# Patient Record
Sex: Female | Born: 1980 | Race: White | Hispanic: No | Marital: Married | Smoking: Former smoker
Health system: Southern US, Community
[De-identification: ages and names within clinical notes are randomized; demographics above are authoritative.]

## PROBLEM LIST (undated history)

## (undated) ENCOUNTER — Ambulatory Visit: Payer: Self-pay

## (undated) ENCOUNTER — Ambulatory Visit: Admission: EM | Payer: BC Managed Care – PPO | Source: Home / Self Care

## (undated) ENCOUNTER — Inpatient Hospital Stay (HOSPITAL_COMMUNITY): Payer: Self-pay

## (undated) DIAGNOSIS — J302 Other seasonal allergic rhinitis: Secondary | ICD-10-CM

## (undated) DIAGNOSIS — R51 Headache: Secondary | ICD-10-CM

## (undated) DIAGNOSIS — R87619 Unspecified abnormal cytological findings in specimens from cervix uteri: Secondary | ICD-10-CM

## (undated) DIAGNOSIS — N83209 Unspecified ovarian cyst, unspecified side: Secondary | ICD-10-CM

## (undated) DIAGNOSIS — F419 Anxiety disorder, unspecified: Secondary | ICD-10-CM

## (undated) DIAGNOSIS — IMO0002 Reserved for concepts with insufficient information to code with codable children: Secondary | ICD-10-CM

## (undated) HISTORY — PX: SALPINGOOPHORECTOMY: SHX82

## (undated) HISTORY — PX: OTHER SURGICAL HISTORY: SHX169

## (undated) HISTORY — PX: WISDOM TOOTH EXTRACTION: SHX21

## (undated) HISTORY — DX: Other seasonal allergic rhinitis: J30.2

---

## 2012-07-21 NOTE — L&D Delivery Note (Signed)
Delivery Note At 11:53 AM a viable and healthy female was delivered via  (Presentation:OA ;  ).  APGAR:7 ,9 ; weight 7lb 15 oz .   Placenta status: spontaneous, intact not sent, .  Cord: CAN x 1 clamped, cut  with the following complications: .none  Cord pH: none Donor cord blood obtained Anesthesia: Epidural  Episiotomy: none Lacerations: none Suture Repair: none Est. Blood Loss (mL): 300 cc  Mom to postpartum.  Baby to nursery-stable.  Farley Crooker A 11/26/2012, 12:09 PM

## 2012-08-04 LAB — OB RESULTS CONSOLE RUBELLA ANTIBODY, IGM: Rubella: IMMUNE

## 2012-08-04 LAB — OB RESULTS CONSOLE GC/CHLAMYDIA
Chlamydia: NEGATIVE
Gonorrhea: NEGATIVE

## 2012-08-04 LAB — OB RESULTS CONSOLE HIV ANTIBODY (ROUTINE TESTING): HIV: NONREACTIVE

## 2012-08-06 ENCOUNTER — Ambulatory Visit (INDEPENDENT_AMBULATORY_CARE_PROVIDER_SITE_OTHER): Payer: BC Managed Care – PPO | Admitting: Emergency Medicine

## 2012-08-06 VITALS — BP 100/63 | HR 113 | Temp 98.5°F | Resp 18 | Ht 65.0 in | Wt 170.0 lb

## 2012-08-06 DIAGNOSIS — J111 Influenza due to unidentified influenza virus with other respiratory manifestations: Secondary | ICD-10-CM

## 2012-08-06 LAB — POCT INFLUENZA A/B
Influenza A, POC: POSITIVE
Influenza B, POC: NEGATIVE

## 2012-08-06 NOTE — Patient Instructions (Addendum)

## 2012-08-06 NOTE — Progress Notes (Signed)
[facility] [address] [place] [address] [phone]  Date:  08/06/2012   Name:  Shelly Burch   DOB:  Jun 12, 1981   MRN:  [id number]  PCP:  No primary provider on file.    Chief Complaint: Fever and Cough   History of Present Illness:  Shelly Burch is a 32 y.o. very pleasant female patient who presents with the following:  Sudden onset of febrile illness last night with cough.  Temp to 103.  No wheezing or shortness of breath.  Cough non productive.  Moderate nasal congestion and post nasal drainage that was clear.  No nausea or vomiting.  Flu shot Wednesday this week.  No ill contacts.  There is no problem list on file for this patient.   No past medical history on file.  Past Surgical History  Procedure Date  . Ovarian tumor removed     History  Substance Use Topics  . Smoking status: Former Smoker    Quit date: 08/07/2007  . Smokeless tobacco: Not on file  . Alcohol Use: No    Family History  Problem Relation Age of Onset  . COPD Mother   . Hypertension Mother   . Heart disease Father   . Stroke Father   . Diabetes Maternal Grandmother   . Diabetes Maternal Grandfather     No Known Allergies  Medication list has been reviewed and updated.  Current Outpatient Prescriptions on File Prior to Visit  Medication Sig Dispense Refill  . DiphenhydrAMINE HCl (BENADRYL ALLERGY PO) Take by mouth.        Review of Systems:  As per HPI, otherwise negative.    Physical Examination: Filed Vitals:   08/06/12 1129  BP: 100/63  Pulse: 113  Temp: 98.5 F (36.9 C)  Resp: 18   Filed Vitals:   08/06/12 1129  Height: 5\' 5"  (1.651 m)  Weight: 170 lb (77.111 kg)   Body mass index is 28.29 kg/(m^2). Ideal Body Weight: Weight in (lb) to have BMI = 25: 149.9   GEN: WDWN, NAD, Non-toxic, A & O x 3 HEENT: Atraumatic, Normocephalic. Neck supple. No masses, No LAD. Ears and Nose: No external deformity. CV: RRR, No M/G/R. No JVD. No  thrill. No extra heart sounds. PULM: CTA B, no wheezes, crackles, rhonchi. No retractions. No resp. distress. No accessory muscle use. ABD: S, NT, ND, +BS. No rebound. No HSM. EXTR: No c/c/e NEURO Normal gait.  PSYCH: Normally interactive. Conversant. Not depressed or anxious appearing.  Calm demeanor.    Assessment and Plan: Influenza Pregnant and tamiflu is a category c medication Robitussin DM Tylenol Fluids   Carmelina Dane, MD  Results for orders placed in visit on 08/06/12  POCT INFLUENZA A/B      Component Value Range   Influenza A, POC Positive     Influenza B, POC Negative     1

## 2012-09-14 ENCOUNTER — Inpatient Hospital Stay (HOSPITAL_COMMUNITY): Admission: AD | Admit: 2012-09-14 | Payer: Self-pay | Source: Ambulatory Visit | Admitting: Obstetrics and Gynecology

## 2012-11-15 ENCOUNTER — Encounter (HOSPITAL_COMMUNITY): Payer: Self-pay | Admitting: *Deleted

## 2012-11-15 ENCOUNTER — Inpatient Hospital Stay (HOSPITAL_COMMUNITY)
Admission: AD | Admit: 2012-11-15 | Discharge: 2012-11-15 | Disposition: A | Discharge status: Home / Self Care | Payer: BC Managed Care – PPO | Source: Ambulatory Visit | Attending: Obstetrics and Gynecology | Admitting: Obstetrics and Gynecology

## 2012-11-15 DIAGNOSIS — O479 False labor, unspecified: Secondary | ICD-10-CM | POA: Insufficient documentation

## 2012-11-15 HISTORY — DX: Reserved for concepts with insufficient information to code with codable children: IMO0002

## 2012-11-15 HISTORY — DX: Anxiety disorder, unspecified: F41.9

## 2012-11-15 HISTORY — DX: Unspecified ovarian cyst, unspecified side: N83.209

## 2012-11-15 HISTORY — DX: Unspecified abnormal cytological findings in specimens from cervix uteri: R87.619

## 2012-11-15 HISTORY — DX: Headache: R51

## 2012-11-15 NOTE — MAU Note (Signed)
Contractions started at 0330.  Coming every 3-58min. No bleeding or leaking. Was closed when last checked.  No problems with preg.

## 2012-11-15 NOTE — MAU Note (Signed)
+  GBS, measuring large.

## 2012-11-15 NOTE — MAU Note (Signed)
Pt does have power of attorney forms/advance directives : Evan.

## 2012-11-15 NOTE — MAU Note (Signed)
Still coming in clusters than breaks, but seem to be stronger.  Getting tougher to walk and talk with ctx's.

## 2012-11-18 ENCOUNTER — Encounter (HOSPITAL_COMMUNITY): Payer: Self-pay | Admitting: *Deleted

## 2012-11-18 ENCOUNTER — Other Ambulatory Visit: Payer: Self-pay | Admitting: Obstetrics and Gynecology

## 2012-11-18 ENCOUNTER — Telehealth (HOSPITAL_COMMUNITY): Payer: Self-pay | Admitting: *Deleted

## 2012-11-18 NOTE — Telephone Encounter (Signed)
Preadmission screen  

## 2012-11-25 ENCOUNTER — Inpatient Hospital Stay (HOSPITAL_COMMUNITY)
Admission: RE | Admit: 2012-11-25 | Discharge: 2012-11-27 | DRG: 373 | Disposition: A | Discharge status: Home / Self Care | Payer: BC Managed Care – PPO | Source: Ambulatory Visit | Attending: Obstetrics and Gynecology | Admitting: Obstetrics and Gynecology

## 2012-11-25 ENCOUNTER — Encounter (HOSPITAL_COMMUNITY): Payer: Self-pay

## 2012-11-25 DIAGNOSIS — Z22330 Carrier of Group B streptococcus: Secondary | ICD-10-CM

## 2012-11-25 DIAGNOSIS — O99892 Other specified diseases and conditions complicating childbirth: Secondary | ICD-10-CM | POA: Diagnosis present

## 2012-11-25 DIAGNOSIS — D509 Iron deficiency anemia, unspecified: Secondary | ICD-10-CM | POA: Diagnosis present

## 2012-11-25 LAB — RPR: RPR Ser Ql: NONREACTIVE

## 2012-11-25 LAB — CBC
MCH: 30.2 pg (ref 26.0–34.0)
MCHC: 33.1 g/dL (ref 30.0–36.0)
MCV: 91.2 fL (ref 78.0–100.0)
Platelets: 182 10*3/uL (ref 150–400)
RBC: 3.31 MIL/uL — ABNORMAL LOW (ref 3.87–5.11)

## 2012-11-25 LAB — TYPE AND SCREEN: Antibody Screen: NEGATIVE

## 2012-11-25 MED ORDER — OXYTOCIN BOLUS FROM INFUSION
500.0000 mL | INTRAVENOUS | Status: DC
  Administered 2012-11-26: 500 mL via INTRAVENOUS

## 2012-11-25 MED ORDER — ACETAMINOPHEN 325 MG PO TABS: 650.0000 mg | ORAL_TABLET | ORAL | Status: DC | PRN

## 2012-11-25 MED ORDER — MISOPROSTOL 25 MCG QUARTER TABLET
25.0000 ug | ORAL_TABLET | ORAL | Status: DC | PRN
  Administered 2012-11-25 – 2012-11-26 (×4): 25 ug via VAGINAL
  Filled 2012-11-25 (×3): qty 0.25
  Filled 2012-11-25: qty 1
  Filled 2012-11-25: qty 0.25

## 2012-11-25 MED ORDER — LACTATED RINGERS IV SOLN
INTRAVENOUS | Status: DC
  Administered 2012-11-25 – 2012-11-26 (×2): via INTRAVENOUS

## 2012-11-25 MED ORDER — CITRIC ACID-SODIUM CITRATE 334-500 MG/5ML PO SOLN: 30.0000 mL | ORAL | Status: DC | PRN

## 2012-11-25 MED ORDER — PENICILLIN G POTASSIUM 5000000 UNITS IJ SOLR
2.5000 10*6.[IU] | INTRAVENOUS | Status: DC
  Administered 2012-11-26 (×4): 2.5 10*6.[IU] via INTRAVENOUS
  Filled 2012-11-25 (×8): qty 2.5

## 2012-11-25 MED ORDER — ONDANSETRON HCL 4 MG/2ML IJ SOLN: 4.0000 mg | Freq: Four times a day (QID) | INTRAMUSCULAR | Status: DC | PRN

## 2012-11-25 MED ORDER — TERBUTALINE SULFATE 1 MG/ML IJ SOLN: 0.2500 mg | Freq: Once | INTRAMUSCULAR | Status: AC | PRN

## 2012-11-25 MED ORDER — LIDOCAINE HCL (PF) 1 % IJ SOLN
30.0000 mL | INTRAMUSCULAR | Status: DC | PRN
  Filled 2012-11-25 (×2): qty 30

## 2012-11-25 MED ORDER — OXYCODONE-ACETAMINOPHEN 5-325 MG PO TABS: 1.0000 | ORAL_TABLET | ORAL | Status: DC | PRN

## 2012-11-25 MED ORDER — OXYTOCIN 10 UNIT/ML IJ SOLN: 10.0000 [IU] | Freq: Once | INTRAMUSCULAR | Status: DC

## 2012-11-25 MED ORDER — ZOLPIDEM TARTRATE 5 MG PO TABS: 5.0000 mg | ORAL_TABLET | Freq: Every evening | ORAL | Status: DC | PRN

## 2012-11-25 MED ORDER — LACTATED RINGERS IV SOLN: 500.0000 mL | INTRAVENOUS | Status: DC | PRN

## 2012-11-25 MED ORDER — DEXTROSE 5 % IV SOLN
5.0000 10*6.[IU] | Freq: Once | INTRAVENOUS | Status: AC
  Administered 2012-11-25: 5 10*6.[IU] via INTRAVENOUS
  Filled 2012-11-25: qty 5

## 2012-11-25 MED ORDER — OXYTOCIN 40 UNITS IN LACTATED RINGERS INFUSION - SIMPLE MED
62.5000 mL/h | INTRAVENOUS | Status: DC
  Administered 2012-11-26: 62.5 mL/h via INTRAVENOUS
  Filled 2012-11-25: qty 1000

## 2012-11-25 MED ORDER — IBUPROFEN 600 MG PO TABS
600.0000 mg | ORAL_TABLET | Freq: Four times a day (QID) | ORAL | Status: DC | PRN
  Administered 2012-11-26: 600 mg via ORAL
  Filled 2012-11-25: qty 1

## 2012-11-25 NOTE — H&P (Signed)
Shelly Burch is a 32 y.o. female presenting for induction labor due to multiparity GBS cx (+)/ PNC complicated by leg swelling  Maternal Medical History:  Contractions: Frequency: irregular.    Fetal activity: Perceived fetal activity is normal.    Prenatal complications: no prenatal complications Prenatal Complications - Diabetes: none.    OB History   Grav Para Term Preterm Abortions TAB SAB Ect Mult Living   3 1 1  0 1 1 0 0 0 1     Past Medical History  Diagnosis Date  . Headache   . Ovarian cyst   . Anxiety   . Abnormal Pap smear     age 89, normal since   Past Surgical History  Procedure Laterality Date  . Ovarian tumor removed    . Wisdom tooth extraction    . Salpingoophorectomy      removal of dermoid cyst   Family History: family history includes COPD in her mother; Cancer in her father and paternal aunt; Diabetes in her maternal grandmother and paternal grandmother; Heart attack in her father; Heart disease in her father; Hypertension in her mother; and Stroke in her father. Social History:  reports that she quit smoking about 5 years ago. Her smoking use included Cigarettes. She smoked 0.00 packs per day. She has never used smokeless tobacco. She reports that she does not drink alcohol or use illicit drugs.   Prenatal Transfer Tool  Maternal Diabetes: No Genetic Screening: Declined Maternal Ultrasounds/Referrals: Normal Fetal Ultrasounds or other Referrals:  None Maternal Substance Abuse:  No Significant Maternal Medications:  None Significant Maternal Lab Results:  Lab values include: Group B Strep positive Other Comments:  None  ROS neg  Dilation: Closed Effacement (%): 50 Station: -2 Exam by:: H Stone RN Blood pressure 122/77, pulse 85, temperature 98.6 F (37 C), temperature source Oral, resp. rate 20, height 5\' 4"  (1.626 m), weight 93.895 kg (207 lb), last menstrual period 02/29/2012. Maternal Exam:  Uterine Assessment: Contraction frequency  is rare.   Abdomen: Patient reports no abdominal tenderness. Estimated fetal weight is 8lb.   Fetal presentation: vertex  Introitus: Normal vulva. Ferning test: not done.   Pelvis: adequate for delivery.   Cervix: Cervix evaluated by digital exam.     Physical Exam  Constitutional: She is oriented to person, place, and time. She appears well-developed and well-nourished.  HENT:  Head: Atraumatic.  Eyes: EOM are normal.  Neck: Neck supple.  Cardiovascular: Normal rate and regular rhythm.   Respiratory: Breath sounds normal.  Musculoskeletal: She exhibits edema.  2-3+  Neurological: She is alert and oriented to person, place, and time.  Skin: Skin is warm and dry.    Prenatal labs: ABO, Rh: A/Positive/-- (01/15 0000) Antibody: Negative (01/15 0000) Rubella: Immune (01/15 0000) RPR: Nonreactive (01/15 0000)  HBsAg: Negative (01/15 0000)  HIV: Non-reactive (01/15 0000)  GBS: Positive (04/17 0000)   Assessment/Plan: Term gestation GBS cx (+) P) admit cytotec, IV PCN. Amniotomy prn. Epidural prn   Lydiah Pong A 11/25/2012, 8:59 PM

## 2012-11-25 NOTE — Progress Notes (Signed)
Shelly Burch is a 32 y.o. G3P1011 at [redacted]w[redacted]d by LMP admitted for induction of labor due to multiparity.  Subjective: No chief complaint on file. S/P cytotec x 1 Cc: min cramping Objective: BP 122/77  Pulse 85  Temp(Src) 98.6 F (37 C) (Oral)  Resp 20  Ht 5\' 4"  (1.626 m)  Wt 93.895 kg (207 lb)  BMI 35.51 kg/m2  LMP 02/29/2012      FHT:  FHR: 140 bpm, variability: moderate,  accelerations:  Present,  decelerations:  Absent UC:   none SVE:   1/50%/-3 posterior vtx Tracing: cat 1  Labs: Lab Results  Component Value Date   WBC 7.2 11/25/2012   HGB 10.0* 11/25/2012   HCT 30.2* 11/25/2012   MCV 91.2 11/25/2012   PLT 182 11/25/2012    Assessment / Plan: Term gestation GBS cx (+) on IV PCN P) 2nd cytotec placed. Cont IV PCN  Anticipated MOD:  NSVD  Kattia Selley A 11/26/2012, 12:00 AM

## 2012-11-26 ENCOUNTER — Inpatient Hospital Stay (HOSPITAL_COMMUNITY): Payer: BC Managed Care – PPO | Admitting: Anesthesiology

## 2012-11-26 ENCOUNTER — Encounter (HOSPITAL_COMMUNITY): Payer: Self-pay | Admitting: Anesthesiology

## 2012-11-26 ENCOUNTER — Encounter (HOSPITAL_COMMUNITY): Payer: Self-pay

## 2012-11-26 MED ORDER — LIDOCAINE HCL (PF) 1 % IJ SOLN
INTRAMUSCULAR | Status: DC | PRN
  Administered 2012-11-26 (×2): 5 mL

## 2012-11-26 MED ORDER — ZOLPIDEM TARTRATE 5 MG PO TABS: 5.0000 mg | ORAL_TABLET | Freq: Every evening | ORAL | Status: DC | PRN

## 2012-11-26 MED ORDER — PHENYLEPHRINE 40 MCG/ML (10ML) SYRINGE FOR IV PUSH (FOR BLOOD PRESSURE SUPPORT)
80.0000 ug | PREFILLED_SYRINGE | INTRAVENOUS | Status: DC | PRN
  Filled 2012-11-26: qty 2

## 2012-11-26 MED ORDER — WITCH HAZEL-GLYCERIN EX PADS: 1.0000 "application " | MEDICATED_PAD | CUTANEOUS | Status: DC | PRN

## 2012-11-26 MED ORDER — BENZOCAINE-MENTHOL 20-0.5 % EX AERO: 1.0000 "application " | INHALATION_SPRAY | CUTANEOUS | Status: DC | PRN

## 2012-11-26 MED ORDER — PHENYLEPHRINE 40 MCG/ML (10ML) SYRINGE FOR IV PUSH (FOR BLOOD PRESSURE SUPPORT)
80.0000 ug | PREFILLED_SYRINGE | INTRAVENOUS | Status: DC | PRN
  Filled 2012-11-26: qty 5
  Filled 2012-11-26: qty 2

## 2012-11-26 MED ORDER — DIBUCAINE 1 % RE OINT: 1.0000 "application " | TOPICAL_OINTMENT | RECTAL | Status: DC | PRN

## 2012-11-26 MED ORDER — LACTATED RINGERS IV SOLN: 500.0000 mL | Freq: Once | INTRAVENOUS | Status: DC

## 2012-11-26 MED ORDER — ONDANSETRON HCL 4 MG/2ML IJ SOLN: 4.0000 mg | INTRAMUSCULAR | Status: DC | PRN

## 2012-11-26 MED ORDER — ONDANSETRON HCL 4 MG PO TABS: 4.0000 mg | ORAL_TABLET | ORAL | Status: DC | PRN

## 2012-11-26 MED ORDER — PRENATAL MULTIVITAMIN CH
1.0000 | ORAL_TABLET | Freq: Every day | ORAL | Status: DC
  Administered 2012-11-27: 1 via ORAL
  Filled 2012-11-26: qty 1

## 2012-11-26 MED ORDER — DIPHENHYDRAMINE HCL 25 MG PO CAPS: 25.0000 mg | ORAL_CAPSULE | Freq: Four times a day (QID) | ORAL | Status: DC | PRN

## 2012-11-26 MED ORDER — SIMETHICONE 80 MG PO CHEW: 80.0000 mg | CHEWABLE_TABLET | ORAL | Status: DC | PRN

## 2012-11-26 MED ORDER — FERROUS SULFATE 325 (65 FE) MG PO TABS: 325.0000 mg | ORAL_TABLET | Freq: Two times a day (BID) | ORAL | Status: DC

## 2012-11-26 MED ORDER — LANOLIN HYDROUS EX OINT: TOPICAL_OINTMENT | CUTANEOUS | Status: DC | PRN

## 2012-11-26 MED ORDER — IBUPROFEN 600 MG PO TABS
600.0000 mg | ORAL_TABLET | Freq: Four times a day (QID) | ORAL | Status: DC
  Administered 2012-11-26 – 2012-11-27 (×4): 600 mg via ORAL
  Filled 2012-11-26 (×4): qty 1

## 2012-11-26 MED ORDER — OXYCODONE-ACETAMINOPHEN 5-325 MG PO TABS: 1.0000 | ORAL_TABLET | ORAL | Status: DC | PRN

## 2012-11-26 MED ORDER — EPHEDRINE 5 MG/ML INJ
10.0000 mg | INTRAVENOUS | Status: DC | PRN
  Filled 2012-11-26: qty 2
  Filled 2012-11-26: qty 4

## 2012-11-26 MED ORDER — FENTANYL 2.5 MCG/ML BUPIVACAINE 1/10 % EPIDURAL INFUSION (WH - ANES)
14.0000 mL/h | INTRAMUSCULAR | Status: DC | PRN
  Administered 2012-11-26: 14 mL/h via EPIDURAL
  Filled 2012-11-26: qty 125

## 2012-11-26 MED ORDER — SENNOSIDES-DOCUSATE SODIUM 8.6-50 MG PO TABS
2.0000 | ORAL_TABLET | Freq: Every day | ORAL | Status: DC
  Administered 2012-11-26: 2 via ORAL

## 2012-11-26 MED ORDER — DIPHENHYDRAMINE HCL 50 MG/ML IJ SOLN: 12.5000 mg | INTRAMUSCULAR | Status: DC | PRN

## 2012-11-26 MED ORDER — EPHEDRINE 5 MG/ML INJ
10.0000 mg | INTRAVENOUS | Status: DC | PRN
  Filled 2012-11-26: qty 2

## 2012-11-26 NOTE — Progress Notes (Signed)
S: notes painful ctx Last cytotec 4 am  O: tracing reactive baseline 140 Ctx q 2-4 mins VE; loose 3 cm/thick/-3  IMP: term gestation GBS cx (+) on PCN P) repeat cytotec. Amniotomy prn. Low pitocin

## 2012-11-26 NOTE — Anesthesia Procedure Notes (Signed)
Epidural Patient location during procedure: OB Start time: 11/26/2012 9:35 AM  Staffing Anesthesiologist: Angus Seller., Harrell Gave. Performed by: anesthesiologist   Preanesthetic Checklist Completed: patient identified, site marked, surgical consent, pre-op evaluation, timeout performed, IV checked, risks and benefits discussed and monitors and equipment checked  Epidural Patient position: sitting Prep: site prepped and draped and DuraPrep Patient monitoring: continuous pulse ox and blood pressure Approach: midline Injection technique: LOR air and LOR saline  Needle:  Needle type: Tuohy  Needle gauge: 17 G Needle length: 9 cm and 9 Needle insertion depth: 6 cm Catheter type: closed end flexible Catheter size: 19 Gauge Catheter at skin depth: 12 cm Test dose: negative  Assessment Events: blood not aspirated, injection not painful, no injection resistance, negative IV test and no paresthesia  Additional Notes Patient identified.  Risk benefits discussed including failed block, incomplete pain control, headache, nerve damage, paralysis, blood pressure changes, nausea, vomiting, reactions to medication both toxic or allergic, and postpartum back pain.  Patient expressed understanding and wished to proceed.  All questions were answered.  Sterile technique used throughout procedure and epidural site dressed with sterile barrier dressing. No paresthesia or other complications noted.The patient did not experience any signs of intravascular injection such as tinnitus or metallic taste in mouth nor signs of intrathecal spread such as rapid motor block. Please see nursing notes for vital signs.

## 2012-11-26 NOTE — Anesthesia Postprocedure Evaluation (Signed)
  Anesthesia Post-op Note  Patient: Shelly Burch  Procedure(s) Performed: * No procedures listed *  Patient Location: Mother/Baby  Anesthesia Type:Epidural  Level of Consciousness: alert , oriented and sedated  Airway and Oxygen Therapy: Patient Spontanous Breathing  Post-op Pain: none  Post-op Assessment: Post-op Vital signs reviewed  Post-op Vital Signs: Reviewed and stable  Complications: No apparent anesthesia complications

## 2012-11-26 NOTE — Anesthesia Preprocedure Evaluation (Signed)
Anesthesia Evaluation  Patient identified by MRN, date of birth, ID band Patient awake    Reviewed: Allergy & Precautions, H&P , Patient's Chart, lab work & pertinent test results  Airway Mallampati: II TM Distance: >3 FB Neck ROM: full    Dental no notable dental hx.    Pulmonary neg pulmonary ROS,  breath sounds clear to auscultation  Pulmonary exam normal       Cardiovascular negative cardio ROS  Rhythm:regular Rate:Normal     Neuro/Psych  Headaches, PSYCHIATRIC DISORDERS negative neurological ROS  negative psych ROS   GI/Hepatic negative GI ROS, Neg liver ROS,   Endo/Other  negative endocrine ROS  Renal/GU negative Renal ROS     Musculoskeletal   Abdominal   Peds  Hematology negative hematology ROS (+)   Anesthesia Other Findings   Reproductive/Obstetrics (+) Pregnancy                           Anesthesia Physical Anesthesia Plan  ASA: II  Anesthesia Plan: Epidural   Post-op Pain Management:    Induction:   Airway Management Planned:   Additional Equipment:   Intra-op Plan:   Post-operative Plan:   Informed Consent: I have reviewed the patients History and Physical, chart, labs and discussed the procedure including the risks, benefits and alternatives for the proposed anesthesia with the patient or authorized representative who has indicated his/her understanding and acceptance.     Plan Discussed with:   Anesthesia Plan Comments:         Anesthesia Quick Evaluation

## 2012-11-27 DIAGNOSIS — D509 Iron deficiency anemia, unspecified: Secondary | ICD-10-CM | POA: Diagnosis present

## 2012-11-27 LAB — CBC
HCT: 25.2 % — ABNORMAL LOW (ref 36.0–46.0)
MCH: 30.1 pg (ref 26.0–34.0)
MCHC: 32.9 g/dL (ref 30.0–36.0)
MCV: 91.3 fL (ref 78.0–100.0)
WBC: 7.5 10*3/uL (ref 4.0–10.5)

## 2012-11-27 MED ORDER — POLYSACCHARIDE IRON COMPLEX 150 MG PO CAPS
150.0000 mg | ORAL_CAPSULE | Freq: Every day | ORAL | Status: DC
Start: 2012-11-27 — End: 2012-11-27
  Administered 2012-11-27: 150 mg via ORAL
  Filled 2012-11-27 (×2): qty 1

## 2012-11-27 MED ORDER — DSS 100 MG PO CAPS: 100.0000 mg | ORAL_CAPSULE | Freq: Every day | ORAL | Status: DC

## 2012-11-27 MED ORDER — POLYSACCHARIDE IRON COMPLEX 150 MG PO CAPS: 150.0000 mg | ORAL_CAPSULE | Freq: Every day | ORAL | Status: DC

## 2012-11-27 MED ORDER — DOCUSATE SODIUM 100 MG PO CAPS
100.0000 mg | ORAL_CAPSULE | Freq: Every day | ORAL | Status: DC
  Administered 2012-11-27: 100 mg via ORAL
  Filled 2012-11-27: qty 1

## 2012-11-27 NOTE — Progress Notes (Signed)
PPD 1 SVD  S:  Reports feeling well - wants early discharge today if ok             Tolerating po/ No nausea or vomiting             Bleeding is light             Pain controlled with motrin only - prefers OTC for home             Up ad lib / ambulatory / voiding QS  Newborn breast feeding well  / female O:               VS: BP 120/68  Pulse 83  Temp(Src) 98.6 F (37 C) (Oral)  Resp 20  Ht 5\' 4"  (1.626 m)  Wt 93.895 kg (207 lb)  BMI 35.51 kg/m2  SpO2 95%  LMP 02/29/2012   LABS:  Recent Labs  11/25/12 1917 11/27/12 0645  WBC 7.2 7.5  HGB 10.0* 8.3*  PLT 182 144*                                        I&O: Intake/Output     05/09 0701 - 05/10 0700 05/10 0701 - 05/11 0700   Blood 300    Total Output 300     Net -300                      Physical Exam:             Alert and oriented X3  Lungs: Clear and unlabored  Heart: regular rate and rhythm / no mumurs  Abdomen: soft, non-tender, non-distended              Fundus: firm, non-tender, U-1  Perineum: intact - mild edema  Lochia: light  Extremities: no edema, no calf pain or tenderness   A: PPD # 1              IDA of pregnancy  Doing well - stable status  P:  Routine post partum orders  Stable for Early DC today - pending DC by Peds             Iron and colace PP x 6 weeks  Marlinda Mike CNM, MSN 11/27/2012, 8:41 AM

## 2012-11-27 NOTE — Discharge Summary (Signed)
Addendum: peripheral edema. Script given for hctz 12.5 mg po qd x 5 days

## 2012-11-27 NOTE — Lactation Note (Signed)
This note was copied from the chart of Shelly Retia Cordle. Lactation Consultation Note  Patient Name: Shelly Burch [id number] Date: 11/27/2012 Reason for consult: Follow-up assessment   Maternal Data    Feeding   LATCH Score/Interventions                      Lactation Tools Discussed/Used     Consult Status Consult Status: Complete  Mom reports that baby has been feeding on and off since midnight. Encouragement given. Reports that she had to pump and bottle feed her first baby due to diff latch but that baby finally latched at 2 months and nursed until 6 months when her supply decreased. Reports that nipples are slightly tender due to all the nursing but intact. No questions at present. Reviewed BFSG and OP appointments as resources for support after DC. To call prn  Pamelia Hoit 11/27/2012, 8:05 AM

## 2012-11-27 NOTE — Discharge Summary (Signed)
Obstetric Discharge Summary  Reason for Admission: induction of labor Prenatal Procedures: none Intrapartum Procedures: spontaneous vaginal delivery and GBS prophylaxis Postpartum Procedures: none Complications-Operative and Postpartum: none Hemoglobin  Date Value Range Status  11/27/2012 8.3* 12.0 - 15.0 g/dL Final     REPEATED TO VERIFY     DELTA CHECK NOTED     HCT  Date Value Range Status  11/27/2012 25.2* 36.0 - 46.0 % Final   ADMISSION LABS: hgb 10.0 / hct 30.0   Physical Exam:  General: alert, cooperative and no distress Lochia: appropriate Uterine Fundus: firm Incision: healing well DVT Evaluation: No evidence of DVT seen on physical exam.  Discharge Diagnoses: Term Pregnancy-delivered and IDA of pregnancy - delivered  Discharge Information: Date: 11/27/2012 Activity: pelvic rest Diet: routine Medications: PNV, Ibuprofen, Colace and Iron   (OTC motrin and tylenol prn) Condition: stable Instructions: refer to practice specific booklet Discharge to: home Follow-up Information   Follow up with COUSINS,SHERONETTE A, MD. Schedule an appointment as soon as possible for a visit in 6 weeks.   Contact information:   [address] [place] [address] [phone]       Newborn Data: Live born female  Birth Weight: 7 lb 15.5 oz (3615 g) APGAR: 7, 9  Home with mother.  Marlinda Mike 11/27/2012, 8:52 AM

## 2013-04-18 ENCOUNTER — Ambulatory Visit (INDEPENDENT_AMBULATORY_CARE_PROVIDER_SITE_OTHER): Payer: BC Managed Care – PPO | Admitting: Physician Assistant

## 2013-04-18 VITALS — BP 130/80 | HR 68 | Temp 98.7°F | Resp 16 | Ht 65.5 in | Wt 160.0 lb

## 2013-04-18 DIAGNOSIS — J329 Chronic sinusitis, unspecified: Secondary | ICD-10-CM

## 2013-04-18 DIAGNOSIS — J302 Other seasonal allergic rhinitis: Secondary | ICD-10-CM | POA: Insufficient documentation

## 2013-04-18 DIAGNOSIS — J309 Allergic rhinitis, unspecified: Secondary | ICD-10-CM

## 2013-04-18 MED ORDER — AMOXICILLIN-POT CLAVULANATE 875-125 MG PO TABS: 1.0000 | ORAL_TABLET | Freq: Two times a day (BID) | ORAL | Status: DC

## 2013-04-18 MED ORDER — FLUTICASONE PROPIONATE 50 MCG/ACT NA SUSP: 2.0000 | Freq: Every day | NASAL | Status: DC

## 2013-04-18 MED ORDER — IPRATROPIUM BROMIDE 0.03 % NA SOLN: 2.0000 | Freq: Two times a day (BID) | NASAL | Status: DC

## 2013-04-18 NOTE — Patient Instructions (Signed)
Get plenty of rest and drink at least 64 ounces of water daily. Once your symptoms resolve, stop the atrovent nasal spray, but continue the Fluticasone throughout your allergy season. If your symptoms worsen again, you can restart the atrovent again.

## 2013-04-18 NOTE — Progress Notes (Signed)
  Subjective:    Patient ID: Shelly Burch, female    DOB: [birth year], 32 y.o.   MRN: [id number]  HPI This 32 y.o. female presents for evaluation of illness. Throat is sore and feels swollen with swallowing. Feverish, "icky" feeling. Nasal congestion, facial pressure, drainage, non-productive cough.    Recently moved here from [place], [place] and notes that the seasons in [place] are a little different.  Her fall allergies normally begin in November, but she developed her first symptoms last week and they "got ahead of me." She initially improved using zyrtec and benadryl, but then developed the sore throat.  Medications, allergies, past medical history, surgical history, family history, social history and problem list reviewed.    Review of Systems As above.    Objective:   Physical Exam  Vitals reviewed. Constitutional: She is oriented to person, place, and time. Vital signs are normal. She appears well-developed and well-nourished. She is active and cooperative. No distress.  HENT:  Head: Normocephalic and atraumatic.  Right Ear: Hearing, tympanic membrane, external ear and ear canal normal.  Left Ear: Hearing, tympanic membrane, external ear and ear canal normal.  Nose: Mucosal edema and rhinorrhea present.  No foreign bodies. Right sinus exhibits maxillary sinus tenderness. Right sinus exhibits no frontal sinus tenderness. Left sinus exhibits maxillary sinus tenderness. Left sinus exhibits no frontal sinus tenderness.  Mouth/Throat: Uvula is midline, oropharynx is clear and moist and mucous membranes are normal. No edematous. No oropharyngeal exudate.  Eyes: Conjunctivae and EOM are normal. Pupils are equal, round, and reactive to light. Right eye exhibits no discharge. Left eye exhibits no discharge. No scleral icterus.  Neck: Trachea normal, normal range of motion and full passive range of motion without pain. Neck supple. No mass and no thyromegaly present.  Cardiovascular: Normal  rate, regular rhythm and normal heart sounds.   Pulmonary/Chest: Effort normal and breath sounds normal.  Lymphadenopathy:       Head (right side): No submandibular, no tonsillar, no preauricular, no posterior auricular and no occipital adenopathy present.       Head (left side): No submandibular, no tonsillar, no preauricular and no occipital adenopathy present.    She has no cervical adenopathy.       Right: No supraclavicular adenopathy present.       Left: No supraclavicular adenopathy present.  Neurological: She is alert and oriented to person, place, and time. She has normal strength. No cranial nerve deficit or sensory deficit.  Skin: Skin is warm, dry and intact. No rash noted.  Psychiatric: She has a normal mood and affect. Her speech is normal and behavior is normal.          Assessment & Plan:  Allergic rhinitis - Plan: fluticasone (FLONASE) 50 MCG/ACT nasal spray  Sinusitis - Plan: ipratropium (ATROVENT) 0.03 % nasal spray, amoxicillin-clavulanate (AUGMENTIN) 875-125 MG per tablet  Supportive care.  Anticipatory guidance.  RTC if symptoms worsen/persist.  Fernande Bras, PA-C Certified Physician Assistant [facility]   Fernande Bras, PA-C Physician Assistant-Certified [facility]

## 2013-06-09 ENCOUNTER — Ambulatory Visit (INDEPENDENT_AMBULATORY_CARE_PROVIDER_SITE_OTHER): Payer: BC Managed Care – PPO | Admitting: Physician Assistant

## 2013-06-09 VITALS — BP 124/80 | HR 69 | Temp 98.6°F | Resp 16 | Ht 64.5 in | Wt 157.8 lb

## 2013-06-09 DIAGNOSIS — M25549 Pain in joints of unspecified hand: Secondary | ICD-10-CM

## 2013-06-09 DIAGNOSIS — S61309A Unspecified open wound of unspecified finger with damage to nail, initial encounter: Secondary | ICD-10-CM

## 2013-06-09 DIAGNOSIS — S61209A Unspecified open wound of unspecified finger without damage to nail, initial encounter: Secondary | ICD-10-CM

## 2013-06-09 NOTE — Progress Notes (Signed)
  Subjective:    Patient ID: Shelly Burch, female    DOB: 07-28-1980, 32 y.o.   MRN: [id number]  HPI   Shelly Burch is a very pleasant 32 yr old female who unfortunately slammed her right thumb in the car door several months ago.  The thumb itself feels much better, but the nail has been trying to come off.  Today she snagged the edge of the nail on something which caused the area to bleed profusely.  Tried to take the nail off herself but too painful.  Hurts almost as bad as when she injured the thumb in the first place.  She otherwise feels well today.  She is right hand dominant.   Review of Systems  Constitutional: Negative for fever and chills.  Respiratory: Negative.   Cardiovascular: Negative.   Musculoskeletal: Negative.   Skin: Positive for wound (right thumb).       Objective:   Physical Exam  Vitals reviewed. Constitutional: She is oriented to person, place, and time. She appears well-developed and well-nourished. No distress.  HENT:  Head: Normocephalic and atraumatic.  Eyes: Conjunctivae are normal. No scleral icterus.  Pulmonary/Chest: Effort normal.  Musculoskeletal:       Hands: Right thumb nail almost completely avulsed from nail bed; new nail growing underneath  Neurological: She is alert and oriented to person, place, and time.  Skin: Skin is warm and dry.  Psychiatric: She has a normal mood and affect. Her behavior is normal.    Procedure Note: Digital block to the right thumb with 3cc 2% plain lidocaine.  Additional 1cc 2% plain lidocaine added locally.  Betadine prep.  Nail easily lifted and removed in total.  A small avulsion of the nail bed occurred with the injury this morning.  Hemostasis was easily obtained with direct pressure and silver nitrate.  Xeroform gauze applied.  Cleansed and dressed.  Pt tolerated very well.      Assessment & Plan:  Nail avulsion, finger, initial encounter   Shelly Burch is a very pleasant 32 yr old female who unfortunately  partially avulsed the right thumb nail.  Procedure as above to remove the remaining nail.  Discussed wound care.  Pt to RTC if concerns arise.  Loleta Dicker MHS, PA-C [facility] 11/20/20144:56 PM

## 2013-06-09 NOTE — Patient Instructions (Signed)
Leave the dressing in place for about 24 hours.  Use Tylenol and/or Advil for pain relief if needed.  If this isn't cutting the pain, please call me and we can try something stronger.  Tomorrow, start doing 5 minute soaks in warm soapy water.  The Xeroform gauze will slowly start to come away.  Keep the area covered if that is more comfortable.  The new nail should grow completely in over the next few months   Nail Avulsion Injury Nail avulsion means that you have lost the whole, or part of a nail. The nail will usually grow back in 2 to 6 months. If your injury damaged the growth center of the nail, the nail may be deformed, split, or not stuck to the nail bed. Sometimes the avulsed nail is stitched back in place. This provides temporary protection to the nail bed until the new nail grows in.  HOME CARE INSTRUCTIONS   Raise (elevate) your injury as much as possible.  Protect the injury and cover it with bandages (dressings) or splints as instructed.  Change dressings as instructed. SEEK MEDICAL CARE IF:   There is increasing pain, redness, or swelling.  You cannot move your fingers or toes. Document Released: 08/14/2004 Document Revised: 09/29/2011 Document Reviewed: 06/08/2009 [facility] Patient Information 2014 [place], [place].

## 2014-01-03 ENCOUNTER — Ambulatory Visit (INDEPENDENT_AMBULATORY_CARE_PROVIDER_SITE_OTHER): Payer: BC Managed Care – PPO | Admitting: Family Medicine

## 2014-01-03 VITALS — BP 110/66 | HR 87 | Temp 98.0°F | Resp 16 | Ht 64.5 in | Wt 150.0 lb

## 2014-01-03 DIAGNOSIS — R05 Cough: Secondary | ICD-10-CM

## 2014-01-03 DIAGNOSIS — R059 Cough, unspecified: Secondary | ICD-10-CM

## 2014-01-03 DIAGNOSIS — J029 Acute pharyngitis, unspecified: Secondary | ICD-10-CM

## 2014-01-03 LAB — POCT RAPID STREP A (OFFICE): RAPID STREP A SCREEN: NEGATIVE

## 2014-01-03 MED ORDER — ALBUTEROL SULFATE HFA 108 (90 BASE) MCG/ACT IN AERS: 2.0000 | INHALATION_SPRAY | RESPIRATORY_TRACT | Status: DC | PRN

## 2014-01-03 MED ORDER — HYDROCOD POLST-CHLORPHEN POLST 10-8 MG/5ML PO LQCR: 5.0000 mL | Freq: Every evening | ORAL | Status: DC | PRN

## 2014-01-03 NOTE — Patient Instructions (Signed)
Pharyngitis °Pharyngitis is redness, pain, and swelling (inflammation) of your pharynx.  °CAUSES  °Pharyngitis is usually caused by infection. Most of the time, these infections are from viruses (viral) and are part of a cold. However, sometimes pharyngitis is caused by bacteria (bacterial). Pharyngitis can also be caused by allergies. Viral pharyngitis may be spread from person to person by coughing, sneezing, and personal items or utensils (cups, forks, spoons, toothbrushes). Bacterial pharyngitis may be spread from person to person by more intimate contact, such as kissing.  °SIGNS AND SYMPTOMS  °Symptoms of pharyngitis include:   °· Sore throat.   °· Tiredness (fatigue).   °· Low-grade fever.   °· Headache. °· Joint pain and muscle aches. °· Skin rashes. °· Swollen lymph nodes. °· Plaque-like film on throat or tonsils (often seen with bacterial pharyngitis). °DIAGNOSIS  °Your health care provider will ask you questions about your illness and your symptoms. Your medical history, along with a physical exam, is often all that is needed to diagnose pharyngitis. Sometimes, a rapid strep test is done. Other lab tests may also be done, depending on the suspected cause.  °TREATMENT  °Viral pharyngitis will usually get better in 3 4 days without the use of medicine. Bacterial pharyngitis is treated with medicines that kill germs (antibiotics).  °HOME CARE INSTRUCTIONS  °· Drink enough water and fluids to keep your urine clear or pale yellow.   °· Only take over-the-counter or prescription medicines as directed by your health care provider:   °· If you are prescribed antibiotics, make sure you finish them even if you start to feel better.   °· Do not take aspirin.   °· Get lots of rest.   °· Gargle with 8 oz of salt water (½ tsp of salt per 1 qt of water) as often as every 1 2 hours to soothe your throat.   °· Throat lozenges (if you are not at risk for choking) or sprays may be used to soothe your throat. °SEEK MEDICAL  CARE IF:  °· You have large, tender lumps in your neck. °· You have a rash. °· You cough up green, yellow-brown, or bloody spit. °SEEK IMMEDIATE MEDICAL CARE IF:  °· Your neck becomes stiff. °· You drool or are unable to swallow liquids. °· You vomit or are unable to keep medicines or liquids down. °· You have severe pain that does not go away with the use of recommended medicines. °· You have trouble breathing (not caused by a stuffy nose). °MAKE SURE YOU:  °· Understand these instructions. °· Will watch your condition. °· Will get help right away if you are not doing well or get worse. °Document Released: 07/07/2005 Document Revised: 04/27/2013 Document Reviewed: 03/14/2013 °Exit[employer]® Patient Information ©2014 [employer]. ° °

## 2014-01-03 NOTE — Progress Notes (Signed)
   Subjective:    Patient ID: Molli PoseyHeather W Crisafulli, female    DOB: September 04, 1980, 33 y.o.   MRN: [id number]  HPI Patient with 1 week history of sore throat. Has had cough for several days with burning in her chest.  Has had small amount brown sputum. Has history of allergies and has been taking sudafed and allergy medicine with no nasal drainage. Has had bad taste in mouth.  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 Feels as though she is "always sick." Her one year old is currently sick with cold symptoms.  Review of Systems Low grade fever, no SOB, no wheezing, feels fatigued, no ear pain.    Objective:   Physical Exam  Vitals reviewed. Constitutional: She is oriented to person, place, and time. She appears well-developed and well-nourished. She appears ill. No distress.  HENT:  Head: Normocephalic and atraumatic.  Right Ear: Tympanic membrane, external ear and ear canal normal.  Left Ear: Tympanic membrane, external ear and ear canal normal.  Nose: Nose normal. Right sinus exhibits no maxillary sinus tenderness and no frontal sinus tenderness. Left sinus exhibits no maxillary sinus tenderness and no frontal sinus tenderness.  Mouth/Throat: Mucous membranes are pale. Posterior oropharyngeal edema present.  Patient with +2 tonsils, malodorous breath.  Eyes: Conjunctivae are normal. Right eye exhibits no discharge. Left eye exhibits no discharge.  Neck: Normal range of motion. Neck supple.  Cardiovascular: Normal rate, regular rhythm and normal heart sounds.   Pulmonary/Chest: Effort normal and breath sounds normal.  Musculoskeletal: Normal range of motion.  Neurological: She is alert and oriented to person, place, and time.  Skin: Skin is warm and dry. She is not diaphoretic.  Psychiatric: She has a normal mood and affect. Her behavior is normal. Judgment and thought content normal.   Results for orders placed in visit on 01/03/14  POCT RAPID STREP A (OFFICE)      Result Value Ref  Range   Rapid Strep A Screen Negative  Negative       Assessment & Plan:  1. Acute pharyngitis - POCT rapid strep A- negative - Culture, Group A Strep  2. Cough -suspect this is viral in origin in this otherwise healthy nonsmoker. - albuterol (PROVENTIL HFA;VENTOLIN HFA) 108 (90 BASE) MCG/ACT inhaler; Inhale 2 puffs into the lungs every 4 (four) hours as needed for wheezing or shortness of breath (cough, shortness of breath or wheezing.).  Dispense: 1 Inhaler; Refill: 1 - chlorpheniramine-HYDROcodone (TUSSIONEX PENNKINETIC ER) 10-8 MG/5ML LQCR; Take 5 mLs by mouth at bedtime as needed for cough (cough).  Dispense: 70 mL; Refill: 0 -provided written and verbal instructions regarding diagnosis and treatments including continuing antihistamine/decongestant. -RTC if worsening or if no improvement in 5-7 days.   Emi Belfasteborah B. Arrietty Dercole, FNP-BC  [facility], [facility]  01/04/2014 10:10 PM

## 2014-01-05 LAB — CULTURE, GROUP A STREP: ORGANISM ID, BACTERIA: NORMAL

## 2014-02-13 ENCOUNTER — Ambulatory Visit (INDEPENDENT_AMBULATORY_CARE_PROVIDER_SITE_OTHER): Payer: BC Managed Care – PPO | Admitting: Family Medicine

## 2014-02-13 VITALS — BP 108/60 | HR 66 | Temp 98.1°F | Resp 16 | Ht 64.5 in | Wt 153.6 lb

## 2014-02-13 DIAGNOSIS — R109 Unspecified abdominal pain: Secondary | ICD-10-CM

## 2014-02-13 DIAGNOSIS — R599 Enlarged lymph nodes, unspecified: Secondary | ICD-10-CM

## 2014-02-13 LAB — POCT UA - MICROSCOPIC ONLY
Bacteria, U Microscopic: NEGATIVE
Casts, Ur, LPF, POC: NEGATIVE
Crystals, Ur, HPF, POC: NEGATIVE
Mucus, UA: NEGATIVE
WBC, UR, HPF, POC: NEGATIVE
YEAST UA: NEGATIVE

## 2014-02-13 LAB — POCT URINALYSIS DIPSTICK
BILIRUBIN UA: NEGATIVE
GLUCOSE UA: NEGATIVE
KETONES UA: NEGATIVE
NITRITE UA: NEGATIVE
Protein, UA: NEGATIVE
Spec Grav, UA: 1.015
Urobilinogen, UA: 0.2
pH, UA: 7.5

## 2014-02-13 LAB — POCT CBC
Granulocyte percent: 77.4 %G (ref 37–80)
HEMATOCRIT: 37 % — AB (ref 37.7–47.9)
HEMOGLOBIN: 12.3 g/dL (ref 12.2–16.2)
LYMPH, POC: 1.9 (ref 0.6–3.4)
MCH: 29.1 pg (ref 27–31.2)
MCHC: 33.1 g/dL (ref 31.8–35.4)
MCV: 87.8 fL (ref 80–97)
MID (cbc): 0.5 (ref 0–0.9)
MPV: 8.8 fL (ref 0–99.8)
POC Granulocyte: 8.1 — AB (ref 2–6.9)
POC LYMPH PERCENT: 18 %L (ref 10–50)
POC MID %: 4.6 % (ref 0–12)
Platelet Count, POC: 182 10*3/uL (ref 142–424)
RBC: 4.21 M/uL (ref 4.04–5.48)
RDW, POC: 14.4 %
WBC: 10.5 10*3/uL — AB (ref 4.6–10.2)

## 2014-02-13 MED ORDER — KETOROLAC TROMETHAMINE 60 MG/2ML IM SOLN
60.0000 mg | Freq: Once | INTRAMUSCULAR | Status: AC
  Administered 2014-02-13: 60 mg via INTRAMUSCULAR

## 2014-02-13 NOTE — Patient Instructions (Signed)
You have an appointment with Dr. Cherly Hensenousins tomorrow at 10:15

## 2014-02-13 NOTE — Progress Notes (Signed)
Subjective:    Patient ID: Shelly PoseyHeather W Burch, female    DOB: [birth year], 33 y.o.   MRN: [id number]  HPI This is a pleasant 33 yo female who presents today with mid back pain that radiates around abdomen and up back. She has noticed this pain with every other menstrual period. This has been going on since she had surgery for more than 1.5 years. Had a normal period last month without pain. This pain started last week, accompanied by fever. Started period yesterday and pain decreased. Last pelvic ultrasound [place] of years ago [place]. 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 Other than routine OB ultrasound for her last pregnancy, she has not had pelvic imaging.  Sees [facility]ins at [facility]. Does not believe she can be pregnant. She is currently menstruating and her husband had vasectomy and has been cleared from urology.   She has also noticed that her lymph nodes in her neck have been swollen and painful for the last several day.  Review of Systems No sore throat, no runny nose, no post nasal drainage, no urinary symptoms, no vaginal pain/itch/discharge.    Objective:   Physical Exam  Vitals reviewed. Constitutional: She is oriented to person, place, and time. She appears well-developed and well-nourished.  HENT:  Head: Normocephalic and atraumatic.  Right Ear: Tympanic membrane, external ear and ear canal normal.  Left Ear: Tympanic membrane, external ear and ear canal normal.  Nose: Nose normal.  Mouth/Throat: Uvula is midline and mucous membranes are normal. No oropharyngeal exudate, posterior oropharyngeal edema or tonsillar abscesses. Posterior oropharyngeal erythema: right side of upper palate erythemaous.  Eyes: Conjunctivae are normal. Right eye exhibits no discharge. Left eye exhibits no discharge.  Neck: Normal range of motion. Neck supple.    Cardiovascular: Normal rate, regular rhythm and normal heart sounds.   Pulmonary/Chest: Effort  normal and breath sounds normal.  Abdominal: Soft. Bowel sounds are normal. She exhibits no distension and no mass. There is no hepatosplenomegaly. There is generalized tenderness. There is no rigidity, no rebound, no guarding, no CVA tenderness, no tenderness at McBurney's point and negative Murphy's sign.  Genitourinary: Vagina normal. Pelvic exam was performed with patient prone. Uterus is enlarged and tender. Cervix exhibits no motion tenderness. Right adnexum displays no mass, no tenderness and no fullness. Left adnexum displays tenderness. Left adnexum displays no mass and no fullness.  Bimanual exam painful. Uterine fullness and pain.  Musculoskeletal: Normal range of motion.  Lymphadenopathy:    She has cervical adenopathy.  Neurological: She is alert and oriented to person, place, and time.  Skin: Skin is warm and dry.  Psychiatric: She has a normal mood and affect. Her behavior is normal. Judgment and thought content normal.   Results for orders placed in visit on 02/13/14  POCT CBC      Result Value Ref Range   WBC 10.5 (*) 4.6 - 10.2 K/uL   Lymph, poc 1.9  0.6 - 3.4   POC LYMPH PERCENT 18.0  10 - 50 %L   MID (cbc) 0.5  0 - 0.9   POC MID % 4.6  0 - 12 %M   POC Granulocyte 8.1 (*) 2 - 6.9   Granulocyte percent 77.4  37 - 80 %G   RBC 4.21  4.04 - 5.48 M/uL   Hemoglobin 12.3  12.2 - 16.2 g/dL   HCT, POC 29.537.0 (*) 28.437.7 - 47.9 %   MCV 87.8  80 -  97 fL   MCH, POC 29.1  27 - 31.2 pg   MCHC 33.1  31.8 - 35.4 g/dL   RDW, POC 16.1     Platelet Count, POC 182  142 - 424 K/uL   MPV 8.8  0 - 99.8 fL  POCT UA - MICROSCOPIC ONLY      Result Value Ref Range   WBC, Ur, HPF, POC neg     RBC, urine, microscopic 8-17     Bacteria, U Microscopic neg     Mucus, UA neg     Epithelial cells, urine per micros 0-4     Crystals, Ur, HPF, POC neg     Casts, Ur, LPF, POC neg     Yeast, UA neg    POCT URINALYSIS DIPSTICK      Result Value Ref Range   Color, UA yellow     Clarity, UA cloudy       Glucose, UA neg     Bilirubin, UA neg     Ketones, UA neg     Spec Grav, UA 1.015     Blood, UA large     pH, UA 7.5     Protein, UA neg     Urobilinogen, UA 0.2     Nitrite, UA neg     Leukocytes, UA Trace         Assessment & Plan:  1. Abdominal pain, unspecified site - POCT CBC- mildly elevated WBC - Comprehensive metabolic panel - POCT UA - Microscopic Only - POCT urinalysis dipstick - ketorolac (TORADOL) injection 60 mg; Inject 2 mLs (60 mg total) into the muscle once- some improvement of pain following injection - Discussed options with patient regarding ordering pelvic US prior to f/u with Dr. Cherly Hensen. Patient would rather have her [place] at [facility]. I have called and made an appointment with Dr. Cherly Hensen for her tomorrow at 10:15 am.  2. Swollen lymph nodes - POCT CBC - Comprehensive metabolic panel - POCT UA - Microscopic Only - POCT urinalysis dipstick   Emi Belfast, FNP-BC  [facility], [facility]  02/13/2014 12:11 PM

## 2014-02-14 LAB — COMPREHENSIVE METABOLIC PANEL
ALT: 18 U/L (ref 0–35)
AST: 18 U/L (ref 0–37)
Albumin: 4.1 g/dL (ref 3.5–5.2)
Alkaline Phosphatase: 78 U/L (ref 39–117)
BUN: 7 mg/dL (ref 6–23)
CALCIUM: 9.3 mg/dL (ref 8.4–10.5)
CHLORIDE: 102 meq/L (ref 96–112)
CO2: 27 meq/L (ref 19–32)
CREATININE: 0.62 mg/dL (ref 0.50–1.10)
GLUCOSE: 94 mg/dL (ref 70–99)
Potassium: 3.7 mEq/L (ref 3.5–5.3)
SODIUM: 137 meq/L (ref 135–145)
TOTAL PROTEIN: 7.4 g/dL (ref 6.0–8.3)
Total Bilirubin: 0.5 mg/dL (ref 0.2–1.2)

## 2019-09-02 DIAGNOSIS — R05 Cough: Secondary | ICD-10-CM | POA: Diagnosis not present

## 2019-09-02 DIAGNOSIS — Z03818 Encounter for observation for suspected exposure to other biological agents ruled out: Secondary | ICD-10-CM | POA: Diagnosis not present

## 2020-03-08 DIAGNOSIS — M62830 Muscle spasm of back: Secondary | ICD-10-CM | POA: Diagnosis not present

## 2020-03-08 DIAGNOSIS — M545 Low back pain: Secondary | ICD-10-CM | POA: Diagnosis not present

## 2020-03-21 DIAGNOSIS — M5416 Radiculopathy, lumbar region: Secondary | ICD-10-CM | POA: Diagnosis not present

## 2020-03-21 DIAGNOSIS — M461 Sacroiliitis, not elsewhere classified: Secondary | ICD-10-CM | POA: Diagnosis not present

## 2020-03-21 DIAGNOSIS — M5136 Other intervertebral disc degeneration, lumbar region: Secondary | ICD-10-CM | POA: Diagnosis not present

## 2020-03-29 DIAGNOSIS — Z1159 Encounter for screening for other viral diseases: Secondary | ICD-10-CM | POA: Diagnosis not present

## 2020-03-29 DIAGNOSIS — Z13220 Encounter for screening for lipoid disorders: Secondary | ICD-10-CM | POA: Diagnosis not present

## 2020-03-29 DIAGNOSIS — R5383 Other fatigue: Secondary | ICD-10-CM | POA: Diagnosis not present

## 2020-03-29 DIAGNOSIS — Z0001 Encounter for general adult medical examination with abnormal findings: Secondary | ICD-10-CM | POA: Diagnosis not present

## 2020-03-29 DIAGNOSIS — Z131 Encounter for screening for diabetes mellitus: Secondary | ICD-10-CM | POA: Diagnosis not present

## 2020-04-02 DIAGNOSIS — M545 Low back pain: Secondary | ICD-10-CM | POA: Diagnosis not present

## 2020-04-13 DIAGNOSIS — M545 Low back pain: Secondary | ICD-10-CM | POA: Diagnosis not present

## 2020-04-16 DIAGNOSIS — M545 Low back pain: Secondary | ICD-10-CM | POA: Diagnosis not present

## 2020-04-19 DIAGNOSIS — M461 Sacroiliitis, not elsewhere classified: Secondary | ICD-10-CM | POA: Diagnosis not present

## 2020-04-19 DIAGNOSIS — M5136 Other intervertebral disc degeneration, lumbar region: Secondary | ICD-10-CM | POA: Diagnosis not present

## 2020-04-19 DIAGNOSIS — M5416 Radiculopathy, lumbar region: Secondary | ICD-10-CM | POA: Diagnosis not present

## 2020-04-20 DIAGNOSIS — M5459 Other low back pain: Secondary | ICD-10-CM | POA: Diagnosis not present

## 2020-04-23 DIAGNOSIS — J069 Acute upper respiratory infection, unspecified: Secondary | ICD-10-CM | POA: Diagnosis not present

## 2020-04-23 DIAGNOSIS — M5459 Other low back pain: Secondary | ICD-10-CM | POA: Diagnosis not present

## 2020-04-27 DIAGNOSIS — M5459 Other low back pain: Secondary | ICD-10-CM | POA: Diagnosis not present

## 2020-04-30 DIAGNOSIS — M5416 Radiculopathy, lumbar region: Secondary | ICD-10-CM | POA: Diagnosis not present

## 2020-04-30 DIAGNOSIS — M5136 Other intervertebral disc degeneration, lumbar region: Secondary | ICD-10-CM | POA: Diagnosis not present

## 2020-05-02 DIAGNOSIS — G5701 Lesion of sciatic nerve, right lower limb: Secondary | ICD-10-CM | POA: Diagnosis not present

## 2020-05-03 DIAGNOSIS — J019 Acute sinusitis, unspecified: Secondary | ICD-10-CM | POA: Diagnosis not present

## 2020-05-03 DIAGNOSIS — J219 Acute bronchiolitis, unspecified: Secondary | ICD-10-CM | POA: Diagnosis not present

## 2020-05-19 DIAGNOSIS — Z23 Encounter for immunization: Secondary | ICD-10-CM | POA: Diagnosis not present

## 2020-05-31 DIAGNOSIS — Z23 Encounter for immunization: Secondary | ICD-10-CM | POA: Diagnosis not present

## 2020-07-09 DIAGNOSIS — J4530 Mild persistent asthma, uncomplicated: Secondary | ICD-10-CM | POA: Diagnosis not present

## 2020-07-10 DIAGNOSIS — K2970 Gastritis, unspecified, without bleeding: Secondary | ICD-10-CM | POA: Diagnosis not present

## 2020-07-10 DIAGNOSIS — Z20822 Contact with and (suspected) exposure to covid-19: Secondary | ICD-10-CM | POA: Diagnosis not present

## 2021-02-28 DIAGNOSIS — M25511 Pain in right shoulder: Secondary | ICD-10-CM | POA: Diagnosis not present

## 2021-03-10 ENCOUNTER — Other Ambulatory Visit: Payer: Self-pay

## 2021-03-10 ENCOUNTER — Emergency Department (HOSPITAL_BASED_OUTPATIENT_CLINIC_OR_DEPARTMENT_OTHER)
Admission: EM | Admit: 2021-03-10 | Discharge: 2021-03-10 | Disposition: A | Discharge status: Home / Self Care | Payer: BC Managed Care – PPO | Attending: Emergency Medicine | Admitting: Emergency Medicine

## 2021-03-10 ENCOUNTER — Encounter (HOSPITAL_BASED_OUTPATIENT_CLINIC_OR_DEPARTMENT_OTHER): Payer: Self-pay | Admitting: Obstetrics and Gynecology

## 2021-03-10 DIAGNOSIS — Y93G1 Activity, food preparation and clean up: Secondary | ICD-10-CM | POA: Diagnosis not present

## 2021-03-10 DIAGNOSIS — S61310A Laceration without foreign body of right index finger with damage to nail, initial encounter: Secondary | ICD-10-CM | POA: Diagnosis not present

## 2021-03-10 DIAGNOSIS — S60029A Contusion of unspecified index finger without damage to nail, initial encounter: Secondary | ICD-10-CM | POA: Diagnosis not present

## 2021-03-10 DIAGNOSIS — W274XXA Contact with kitchen utensil, initial encounter: Secondary | ICD-10-CM | POA: Diagnosis not present

## 2021-03-10 DIAGNOSIS — S61209A Unspecified open wound of unspecified finger without damage to nail, initial encounter: Secondary | ICD-10-CM

## 2021-03-10 DIAGNOSIS — Z87891 Personal history of nicotine dependence: Secondary | ICD-10-CM | POA: Insufficient documentation

## 2021-03-10 DIAGNOSIS — S6991XA Unspecified injury of right wrist, hand and finger(s), initial encounter: Secondary | ICD-10-CM | POA: Diagnosis not present

## 2021-03-10 NOTE — ED Provider Notes (Signed)
[facility] EMERGENCY DEPT Provider Note   CSN: [id number] Arrival date & time: 03/10/21  1435     History Chief Complaint  Patient presents with   Finger Injury    Shelly Burch is a 40 y.o. female.  Patient had approximately 1 hour prior to arrival cut her right index finger on a mandolin while cutting onions.  Last tetanus shot was 2020.  Patient went to urgent care they were unable to stop the bleeding at home.  Urgent care reinforced the dressing but not take the dressing down.  Stated that they patient had to go to the emergency department.  Patient is right-hand dominant.  Past medical history noncontributory.      Past Medical History:  Diagnosis Date   Abnormal Pap smear    age 57, normal since   Anxiety    Headache(784.0)    Ovarian cyst    Seasonal allergies     Patient Active Problem List   Diagnosis Date Noted   Seasonal allergies     Past Surgical History:  Procedure Laterality Date   ovarian tumor removed Left    removal of dermoid cyst   SALPINGOOPHORECTOMY Right    removal of dermoid cyst AND ovary   WISDOM TOOTH EXTRACTION       OB History     Gravida  3   Para  2   Term  2   Preterm  0   AB  1   Living  2      SAB  0   IAB  1   Ectopic  0   Multiple  0   Live Births  2           Family History  Problem Relation Age of Onset   COPD Mother    Hypertension Mother    Heart disease Father    Stroke Father    Heart attack Father    Cancer Father        colon   Diabetes Maternal Grandmother    Diabetes Paternal Grandmother    Cancer Paternal Aunt        breast    Social History   Tobacco Use   Smoking status: Former    Types: Cigarettes    Quit date: 08/07/2007    Years since quitting: 13.6   Smokeless tobacco: Never  Substance Use Topics   Alcohol use: No   Drug use: No    Home Medications Prior to Admission medications   Medication Sig Start Date End Date Taking? Authorizing Provider   albuterol (PROVENTIL HFA;VENTOLIN HFA) 108 (90 BASE) MCG/ACT inhaler Inhale 2 puffs into the lungs every 4 (four) hours as needed for wheezing or shortness of breath (cough, shortness of breath or wheezing.). 01/03/14   Emi Belfast, FNP  chlorpheniramine-HYDROcodone (TUSSIONEX PENNKINETIC ER) 10-8 MG/5ML LQCR Take 5 mLs by mouth at bedtime as needed for cough (cough). 01/03/14   Emi Belfast, FNP  dextromethorphan-guaiFENesin (ROBITUSSIN-DM) 10-100 MG/5ML liquid Take 10 mLs by mouth every 4 (four) hours as needed for cough.    [provider]  diphenhydrAMINE (BENADRYL) 25 MG tablet Take 25-50 mg by mouth 2 (two) times daily as needed for allergies (Take 1 tablet by mouth in the morning and two tablets by mouth at night as needed)).     [provider]  fexofenadine-pseudoephedrine (ALLEGRA-D 24) 180-240 MG per 24 hr tablet Take 1 tablet by mouth daily.    [provider]  Allergies    Patient has no known allergies.  Review of Systems   Review of Systems  Constitutional:  Negative for chills and fever.  HENT:  Negative for ear pain and sore throat.   Eyes:  Negative for pain and visual disturbance.  Respiratory:  Negative for cough and shortness of breath.   Cardiovascular:  Negative for chest pain and palpitations.  Gastrointestinal:  Negative for abdominal pain and vomiting.  Genitourinary:  Negative for dysuria and hematuria.  Musculoskeletal:  Negative for arthralgias and back pain.  Skin:  Positive for wound. Negative for color change and rash.  Neurological:  Negative for seizures and syncope.  All other systems reviewed and are negative.  Physical Exam Updated Vital Signs BP (!) 169/92   Pulse 64   Temp 98.5 F (36.9 C)   Resp 18   Ht 1.651 m (5\' 5" )   Wt 65.8 kg   SpO2 100%   BMI 24.13 kg/m   Physical Exam Vitals and nursing note reviewed.  Constitutional:      General: She is not in acute distress.    Appearance: She is  well-developed.  HENT:     Head: Normocephalic and atraumatic.  Eyes:     Conjunctiva/sclera: Conjunctivae normal.  Cardiovascular:     Rate and Rhythm: Normal rate and regular rhythm.     Heart sounds: No murmur heard. Pulmonary:     Effort: Pulmonary effort is normal. No respiratory distress.     Breath sounds: Normal breath sounds.  Abdominal:     Palpations: Abdomen is soft.     Tenderness: There is no abdominal tenderness.  Musculoskeletal:        General: Signs of injury present.     Cervical back: Neck supple.     Comments: Right index finger with a an avulsion laceration just to the tip of the finger partially cutting through the very tip of the nail but not really getting into the nailbed.  The avulsion measures about 5 mm.  Bleeding now controlled.  Good range of motion of the index finger.  Cap refill intact.  Sensation intact.  Skin:    General: Skin is warm and dry.  Neurological:     General: No focal deficit present.     Mental Status: She is alert and oriented to person, place, and time.    ED Results / Procedures / Treatments   Labs (all labs ordered are listed, but only abnormal results are displayed) Labs Reviewed - No data to display  EKG None  Radiology No results found.  Procedures Procedures   Medications Ordered in ED Medications - No data to display  ED Course  I have reviewed the triage vital signs and the nursing notes.  Pertinent labs & imaging results that were available during my care of the patient were reviewed by me and considered in my medical decision making (see chart for details).    MDM Rules/Calculators/A&P                           Avulsion to the tip of the right index finger just a small portion of tissue removed.  Involving some of the nail but not really the nailbed.  Bleeding now controlled.  We will soak finger to clean it.  Tetanus is up-to-date.  We will do antibiotic ointment and a finger dressing.  Have the patient  keep the finger dressing on for 2 days then can  remove and reapply antibiotic ointment and Band-Aids.  The wound should heal on its own just fine.  Nothing to suture nothing to glue.   Final Clinical Impression(s) / ED Diagnoses Final diagnoses:  None    Rx / DC Orders ED Discharge Orders     None        Vanetta Mulders, MD 03/10/21 [phone]

## 2021-03-10 NOTE — Discharge Instructions (Addendum)
Keep the current dressing in place for the next 2 days.  On Wednesday you can remove the dressing.  Wash gently with soap and water.  Reapply antibiotic ointment and then reapply Band-Aid dressing.  Avoid soaking the finger in any kind of chemicals or water for prolonged period of time.  Return for any new or worse symptoms.  Would expect to the finger to heal completely on its own.

## 2021-03-10 NOTE — ED Triage Notes (Signed)
Patient sliced her right pointer finger on the mandolin, was not using a guard. Patient last tetanus shot was in 2020.

## 2021-03-10 NOTE — ED Notes (Signed)
Suture Cart at bedside if needed.

## 2021-06-09 DIAGNOSIS — Z23 Encounter for immunization: Secondary | ICD-10-CM | POA: Diagnosis not present

## 2021-07-16 ENCOUNTER — Encounter (HOSPITAL_BASED_OUTPATIENT_CLINIC_OR_DEPARTMENT_OTHER): Payer: Self-pay | Admitting: Family Medicine

## 2021-07-16 ENCOUNTER — Ambulatory Visit (HOSPITAL_BASED_OUTPATIENT_CLINIC_OR_DEPARTMENT_OTHER): Payer: BC Managed Care – PPO | Admitting: Family Medicine

## 2021-07-16 ENCOUNTER — Other Ambulatory Visit: Payer: Self-pay

## 2021-07-16 VITALS — BP 130/72 | HR 65 | Ht 65.0 in

## 2021-07-16 DIAGNOSIS — J302 Other seasonal allergic rhinitis: Secondary | ICD-10-CM | POA: Diagnosis not present

## 2021-07-16 DIAGNOSIS — Z0000 Encounter for general adult medical examination without abnormal findings: Secondary | ICD-10-CM

## 2021-07-16 DIAGNOSIS — F419 Anxiety disorder, unspecified: Secondary | ICD-10-CM | POA: Diagnosis not present

## 2021-07-16 MED ORDER — HYDROXYZINE HCL 10 MG PO TABS: 10.0000 mg | ORAL_TABLET | Freq: Four times a day (QID) | ORAL | 1 refills | Status: DC | PRN

## 2021-07-16 MED ORDER — TRIAMCINOLONE ACETONIDE 55 MCG/ACT NA AERO: 2.0000 | INHALATION_SPRAY | Freq: Every day | NASAL | 1 refills | Status: DC

## 2021-07-16 NOTE — Assessment & Plan Note (Signed)
Recommend proceeding with use of Nasacort as she has had good relief with this in the past Can also continue with use of hydroxyzine Can continue with Claritin

## 2021-07-16 NOTE — Assessment & Plan Note (Signed)
Reports having tried medications in the past Will be establishing with online counselor, encouraged to do so If any difficulties with above, can consider referral to someone locally who may also be able to meet for virtual visits Can continue with hydroxyzine for now which was prescribed recently through urgent care and had good relief of symptoms

## 2021-07-16 NOTE — Progress Notes (Signed)
New Patient Office Visit  Subjective:  Patient ID: Shelly Burch, female    DOB: 1980-08-06  Age: 40 y.o. MRN: [id number]  CC:  Chief Complaint  Patient presents with   Establish Care    Prior PCP - [facility] in [facility] [place]   Medication Refill    Patient would like to have hydroxyzine refilled. She states it was prescribed to her for her anxiety and nasal congestion    HPI Shelly Burch is a 40 year old female presenting to establish in clinic.  She has current concerns as outlined above.  Past medical history significant for anxiety, allergic rhinitis.  Allergic rhinitis: Recently moved to the area in July from [facility], [place].  Reports that she has had some issues in the past with allergies at times, not typically bothersome when she is living where she is originally from.  Has had more recent issues since moving to the area in July.  Previously used Nasacort in the past with moderate relief of symptoms.  She was also recently prescribed hydroxyzine for help with sinus congestion, feelings of anxiety.  Anxiety: Reports history of anxiety which has primarily been managed through nonpharmacologic measures, behavioral interventions.  Reports trying "all medications".  She does indicate trying Zoloft and Effexor in the past as well as other medications.  Indicates that she did not tolerate medications due to side effects, such as dizziness.  Has completed some therapy/counseling the past, was not involved in any recently has symptoms have largely been controlled up until recent move.  Since moving July, has had some noticeable increase in her anxiety symptoms, plans to start working with online counselor soon.  Does report family history of colon cancer in her father at age 73.  As a result she began having screening colonoscopy 5 years ago.  Recommendation was for repeat colonoscopy in about 5 years and thus that she is due, requesting referral today  She is originally  from [facility], [place].  Moved here in July.  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  Outside of work, she enjoys running, reading, biking with her girls.  Past Medical History:  Diagnosis Date   Abnormal Pap smear    age 69, normal since   Anxiety    Headache(784.0)    Ovarian cyst    Seasonal allergies     Past Surgical History:  Procedure Laterality Date   ovarian tumor removed Left    removal of dermoid cyst   SALPINGOOPHORECTOMY Right    removal of dermoid cyst AND ovary   WISDOM TOOTH EXTRACTION      Family History  Problem Relation Age of Onset   COPD Mother    Hypertension Mother    Heart disease Father    Stroke Father    Heart attack Father    Cancer Father        colon   Diabetes Maternal Grandmother    Diabetes Paternal Grandmother    Cancer Paternal Aunt        breast    Social History   Socioeconomic History   Marital status: Married    Spouse name: Visual merchandiser   Number of children: 2   Years of education: 14   Highest education level: Not on file  Occupational History   Occupation: Tax adviser: [employer]    Comment: [employer]  Tobacco Use   Smoking status: Former    Types: Cigarettes  Quit date: 08/07/2007    Years since quitting: 13.9   Smokeless tobacco: Never  Substance and Sexual Activity   Alcohol use: No   Drug use: No   Sexual activity: Yes  Other Topics Concern   Not on file  Social History Narrative   Moved here from [place], [place] [address].   Lives with her husband and their two daughters.   Social Determinants of Health   Financial Resource Strain: Not on file  Food Insecurity: Not on file  Transportation Needs: Not on file  Physical Activity: Not on file  Stress: Not on file  Social Connections: Not on file  Intimate Partner Violence: Not on file    Objective:   Today's Vitals: BP 130/72    Pulse 65    Ht 5\' 5"  (1.651 m)    LMP 07/12/2021    SpO2 100%    BMI 24.13 kg/m    Physical Exam  40 year old female in no acute distress Cardiovascular exam with regular rate and rhythm, faint systolic murmur appreciated Lungs clear to auscultation bilaterally  Assessment & Plan:   Problem List Items Addressed This Visit       Other   Seasonal allergies - Primary    Recommend proceeding with use of Nasacort as she has had good relief with this in the past Can also continue with use of hydroxyzine Can continue with Claritin      Anxiety    Reports having tried medications in the past Will be establishing with online counselor, encouraged to do so If any difficulties with above, can consider referral to someone locally who may also be able to meet for virtual visits Can continue with hydroxyzine for now which was prescribed recently through urgent care and had good relief of symptoms      Relevant Medications   hydrOXYzine (ATARAX) 10 MG tablet   Other Visit Diagnoses     Wellness examination       Relevant Orders   CBC with Differential/Platelet   Comprehensive metabolic panel   Lipid panel   TSH Rfx on Abnormal to Free T4       Outpatient Encounter Medications as of 07/16/2021  Medication Sig   Cyanocobalamin (VITAMIN B-12 CR PO) Take 1 capsule by mouth daily.   diphenhydrAMINE (BENADRYL) 25 MG tablet Take 25-50 mg by mouth 2 (two) times daily as needed for allergies (Take 1 tablet by mouth in the morning and two tablets by mouth at night as needed)).    loratadine (CLARITIN) 10 MG tablet Take 10 mg by mouth daily.   Multiple Vitamins-Minerals (MULTIVITAMIN ADULTS PO) Take 1 tablet by mouth daily.   triamcinolone (NASACORT) 55 MCG/ACT AERO nasal inhaler Place 2 sprays into the nose daily.   [DISCONTINUED] hydrOXYzine (ATARAX) 10 MG tablet Take 1 tablet by mouth every 4 (four) hours as needed.   hydrOXYzine (ATARAX) 10 MG tablet Take 1 tablet (10 mg total) by mouth every 6 (six) hours as needed.   [DISCONTINUED] albuterol (PROVENTIL HFA;VENTOLIN  HFA) 108 (90 BASE) MCG/ACT inhaler Inhale 2 puffs into the lungs every 4 (four) hours as needed for wheezing or shortness of breath (cough, shortness of breath or wheezing.). (Patient not taking: Reported on 07/16/2021)   [DISCONTINUED] chlorpheniramine-HYDROcodone (TUSSIONEX PENNKINETIC ER) 10-8 MG/5ML LQCR Take 5 mLs by mouth at bedtime as needed for cough (cough). (Patient not taking: Reported on 07/16/2021)   [DISCONTINUED] dextromethorphan-guaiFENesin (ROBITUSSIN-DM) 10-100 MG/5ML liquid Take 10 mLs by mouth every 4 (four) hours as needed for  cough. (Patient not taking: Reported on 07/16/2021)   [DISCONTINUED] fexofenadine-pseudoephedrine (ALLEGRA-D 24) 180-240 MG per 24 hr tablet Take 1 tablet by mouth daily. (Patient not taking: Reported on 07/16/2021)   No facility-administered encounter medications on file as of 07/16/2021.    Follow-up: Return in about 4 months (around 11/14/2021).  Plan for CPE in 1 to 4 months, no sooner as patient desires.  Nurse visit for labs to be completed 1 week prior to CPE  Leili Eskenazi J De Peru, MD

## 2021-07-16 NOTE — Patient Instructions (Addendum)
°  Medication Instructions:  Your physician has recommended you make the following change in your medication:  -- START Nasacort - Spray into each nostril daily  --If you need a refill on any your medications before your next appointment, please call your pharmacy first. If no refills are authorized on file call the office.--  Referrals/Procedures/Imaging: A referral has been placed for you to [facility] for evaluation and treatment. Someone from the scheduling department will be in contact with you in regards to coordinating your consultation. If you do not hear from any of the schedulers within 7-10 business days please give their office a call.  [facility] [address] [place], [place] [address] Phone: [phone]  Follow-Up: Your next appointment:   Your physician recommends that you schedule a follow-up appointment in: 1-4 MONTHS for CPE with Dr. de Peru  You will receive a text message or e-mail with a link to a survey about your care and experience with [place] today! We would greatly appreciate your feedback!   Thanks for letting us be apart of your health journey!!  Primary Care and Sports Medicine   Dr. Ceasar Mons Peru   We encourage you to activate your patient portal called "MyChart".  Sign up information is provided on this After Visit Summary.  MyChart is used to connect with patients for Virtual Visits (Telemedicine).  Patients are able to view lab/test results, encounter notes, upcoming appointments, etc.  Non-urgent messages can be sent to your provider as well. To learn more about what you can do with MyChart, please visit --  [email or web address].

## 2021-09-05 ENCOUNTER — Telehealth (HOSPITAL_BASED_OUTPATIENT_CLINIC_OR_DEPARTMENT_OTHER): Payer: Self-pay | Admitting: Family Medicine

## 2021-09-05 NOTE — Telephone Encounter (Signed)
Referral was faxed for colonoscopy on 07/19/21 Will refax referral

## 2021-09-05 NOTE — Telephone Encounter (Signed)
Pt called in regarding her referral that was suppose to be put in for [facility]. Pt talked about this with provider regarding this referral at her appt. No referral found in pts chart. Please advise.

## 2021-10-28 ENCOUNTER — Ambulatory Visit (HOSPITAL_BASED_OUTPATIENT_CLINIC_OR_DEPARTMENT_OTHER): Payer: BC Managed Care – PPO

## 2021-10-28 ENCOUNTER — Other Ambulatory Visit (HOSPITAL_BASED_OUTPATIENT_CLINIC_OR_DEPARTMENT_OTHER): Payer: Self-pay | Admitting: Family Medicine

## 2021-10-28 DIAGNOSIS — Z0000 Encounter for general adult medical examination without abnormal findings: Secondary | ICD-10-CM | POA: Diagnosis not present

## 2021-10-29 LAB — COMPREHENSIVE METABOLIC PANEL
ALT: 11 IU/L (ref 0–32)
AST: 10 IU/L (ref 0–40)
Albumin/Globulin Ratio: 1.7 (ref 1.2–2.2)
Albumin: 4.3 g/dL (ref 3.8–4.8)
Alkaline Phosphatase: 45 IU/L (ref 44–121)
BUN/Creatinine Ratio: 8 — ABNORMAL LOW (ref 9–23)
BUN: 7 mg/dL (ref 6–24)
Bilirubin Total: 0.3 mg/dL (ref 0.0–1.2)
CO2: 28 mmol/L (ref 20–29)
Calcium: 9.5 mg/dL (ref 8.7–10.2)
Chloride: 102 mmol/L (ref 96–106)
Creatinine, Ser: 0.87 mg/dL (ref 0.57–1.00)
Globulin, Total: 2.5 g/dL (ref 1.5–4.5)
Glucose: 95 mg/dL (ref 70–99)
Potassium: 4.5 mmol/L (ref 3.5–5.2)
Sodium: 142 mmol/L (ref 134–144)
Total Protein: 6.8 g/dL (ref 6.0–8.5)
eGFR: 86 mL/min/{1.73_m2} (ref >59)

## 2021-10-29 LAB — CBC WITH DIFFERENTIAL/PLATELET
Basophils Absolute: 0.1 10*3/uL (ref 0.0–0.2)
Basos: 1 %
EOS (ABSOLUTE): 0.1 10*3/uL (ref 0.0–0.4)
Eos: 2 %
Hematocrit: 37.6 % (ref 34.0–46.6)
Hemoglobin: 12.5 g/dL (ref 11.1–15.9)
Immature Grans (Abs): 0 10*3/uL (ref 0.0–0.1)
Immature Granulocytes: 0 %
Lymphocytes Absolute: 1.5 10*3/uL (ref 0.7–3.1)
Lymphs: 29 %
MCH: 31.6 pg (ref 26.6–33.0)
MCHC: 33.2 g/dL (ref 31.5–35.7)
MCV: 95 fL (ref 79–97)
Monocytes Absolute: 0.3 10*3/uL (ref 0.1–0.9)
Monocytes: 7 %
Neutrophils Absolute: 3.1 10*3/uL (ref 1.4–7.0)
Neutrophils: 61 %
Platelets: 255 10*3/uL (ref 150–450)
RBC: 3.95 x10E6/uL (ref 3.77–5.28)
RDW: 12 % (ref 11.7–15.4)
WBC: 5 10*3/uL (ref 3.4–10.8)

## 2021-10-29 LAB — TSH RFX ON ABNORMAL TO FREE T4: TSH: 1.53 u[IU]/mL (ref 0.450–4.500)

## 2021-10-29 LAB — LIPID PANEL
Chol/HDL Ratio: 1.8 ratio (ref 0.0–4.4)
Cholesterol, Total: 162 mg/dL (ref 100–199)
HDL: 92 mg/dL (ref >39)
LDL Chol Calc (NIH): 49 mg/dL (ref 0–99)
Triglycerides: 124 mg/dL (ref 0–149)
VLDL Cholesterol Cal: 21 mg/dL (ref 5–40)

## 2021-11-06 ENCOUNTER — Encounter (HOSPITAL_BASED_OUTPATIENT_CLINIC_OR_DEPARTMENT_OTHER): Payer: Self-pay | Admitting: Family Medicine

## 2021-11-06 ENCOUNTER — Ambulatory Visit (INDEPENDENT_AMBULATORY_CARE_PROVIDER_SITE_OTHER): Payer: BC Managed Care – PPO | Admitting: Family Medicine

## 2021-11-06 DIAGNOSIS — Z7689 Persons encountering health services in other specified circumstances: Secondary | ICD-10-CM

## 2021-11-06 DIAGNOSIS — Z0000 Encounter for general adult medical examination without abnormal findings: Secondary | ICD-10-CM | POA: Diagnosis not present

## 2021-11-06 DIAGNOSIS — J302 Other seasonal allergic rhinitis: Secondary | ICD-10-CM

## 2021-11-06 NOTE — Progress Notes (Signed)
?Subjective:   ? ?CC: Annual Physical Exam ? ?HPI:  ?Shelly Burch is a 41 y.o. presenting for annual physical ? ?I reviewed the past medical history, family history, social history, surgical history, and allergies today and no changes were needed.  Please see the problem list section below in epic for further details. ? ?Past Medical History: ?Past Medical History:  ?Diagnosis Date  ? Abnormal Pap smear   ? age 64, normal since  ? Anxiety   ? Headache(784.0)   ? Ovarian cyst   ? Seasonal allergies   ? ?Past Surgical History: ?Past Surgical History:  ?Procedure Laterality Date  ? ovarian tumor removed Left   ? removal of dermoid cyst  ? SALPINGOOPHORECTOMY Right   ? removal of dermoid cyst AND ovary  ? WISDOM TOOTH EXTRACTION    ? ?Social History: ?Social History  ? ?Socioeconomic History  ? Marital status: Married  ?  Spouse name: Clayburn Pert  ? Number of children: 2  ? Years of education: 47  ? Highest education level: Not on file  ?Occupational History  ? Occupation: Field seismologist  ?  Employer: [employer]  ?  Comment: [employer]  ?Tobacco Use  ? Smoking status: Former  ?  Types: Cigarettes  ?  Quit date: 08/07/2007  ?  Years since quitting: 14.2  ? Smokeless tobacco: Never  ?Substance and Sexual Activity  ? Alcohol use: No  ? Drug use: No  ? Sexual activity: Yes  ?Other Topics Concern  ? Not on file  ?Social History Narrative  ? Moved here from [place], [place] 2013.  ? Lives with her husband and their two daughters.  ? ?Social Determinants of Health  ? ?Financial Resource Strain: Not on file  ?Food Insecurity: Not on file  ?Transportation Needs: Not on file  ?Physical Activity: Not on file  ?Stress: Not on file  ?Social Connections: Not on file  ? ?Family History: ?Family History  ?Problem Relation Age of Onset  ? COPD Mother   ? Hypertension Mother   ? Heart disease Father   ? Stroke Father   ? Heart attack Father   ? Cancer Father   ?     colon  ? Diabetes Maternal Grandmother   ? Diabetes Paternal Grandmother   ?  Cancer Paternal Aunt   ?     breast  ? ?Allergies: ?No Known Allergies ?Medications: See med rec. ? ?Review of Systems: No headache, visual changes, nausea, vomiting, diarrhea, constipation, dizziness, abdominal pain, skin rash, fevers, chills, night sweats, swollen lymph nodes, weight loss, chest pain, body aches, joint swelling, muscle aches, shortness of breath, mood changes, visual or auditory hallucinations. ? ?Objective:   ? ?BP 138/78   Pulse 86   Temp 98.7 ?F (37.1 ?C)   Ht 5\' 5"  (1.651 m)   Wt 153 lb (69.4 kg)   SpO2 97%   BMI 25.46 kg/m?  ? ?General: Well Developed, well nourished, and in no acute distress.  ?Neuro: Alert and oriented x3, extra-ocular muscles intact, sensation grossly intact. Cranial nerves II through XII are intact, motor, sensory, and coordinative functions are all intact. ?HEENT: Normocephalic, atraumatic, pupils equal round reactive to light, neck supple, no masses, no lymphadenopathy, thyroid nonpalpable. Oropharynx, nasopharynx, external ear canals are unremarkable. ?Skin: Warm and dry, no rashes noted.  ?Cardiac: Regular rate and rhythm, no murmurs rubs or gallops.  ?Respiratory: Clear to auscultation bilaterally. Not using accessory muscles, speaking in full sentences.  ?Abdominal: Soft, nontender, nondistended, positive bowel  sounds, no masses, no organomegaly.  ?Musculoskeletal: Shoulder, elbow, wrist, hip, knee, ankle stable, and with full range of motion. ? ?Impression and Recommendations:   ? ?Wellness examination ?Routine HCM labs reviewed. HCM reviewed/discussed. Anticipatory guidance regarding healthy weight, lifestyle and choices given. ?Recommend healthy diet.  Recommend approximately 150 minutes/week of moderate intensity exercise ?Recommend regular dental and vision exams ?Always use seatbelt/lap and shoulder restraints ?Recommend using smoke alarms and checking batteries at least twice a year ?Recommend using sunscreen when outside ?Patient does have follow-up  with GI scheduled for next month - had colonoscopy about 5 years ago due to family history of colon cancer and some food intolerances in the past ?Would like referral to OBGYN to establish and to complete cervical cancer screening ? ?Seasonal allergies ?Still with some sinus pressure at times over frontal sinuses. She is using Allegra, Nasacort with some relief ?Discussed possibility of continuing with Allegra, can try Flonase as an alternative to Nasacort. If still having persistent symptoms, can consider azelastine nasal spray ?If issues persist despite above, advised to contact the office and likely could proceed with referral to allergist ? ?Plan for follow-up in 1 year for CPE or sooner as needed ? ? ?___________________________________________ ?Adely Facer de Peru, MD, ABFM, CAQSM ?Primary Care and Sports Medicine ?[facility] ?

## 2021-11-06 NOTE — Assessment & Plan Note (Signed)
Still with some sinus pressure at times over frontal sinuses. She is using Allegra, Nasacort with some relief ?Discussed possibility of continuing with Allegra, can try Flonase as an alternative to Nasacort. If still having persistent symptoms, can consider azelastine nasal spray ?If issues persist despite above, advised to contact the office and likely could proceed with referral to allergist ?

## 2021-11-06 NOTE — Assessment & Plan Note (Addendum)
Routine HCM labs reviewed. HCM reviewed/discussed. Anticipatory guidance regarding healthy weight, lifestyle and choices given. ?Recommend healthy diet.  Recommend approximately 150 minutes/week of moderate intensity exercise ?Recommend regular dental and vision exams ?Always use seatbelt/lap and shoulder restraints ?Recommend using smoke alarms and checking batteries at least twice a year ?Recommend using sunscreen when outside ?Patient does have follow-up with GI scheduled for next month - had colonoscopy about 5 years ago due to family history of colon cancer and some food intolerances in the past ?Would like referral to OBGYN to establish and to complete cervical cancer screening ?

## 2021-11-27 DIAGNOSIS — Z7689 Persons encountering health services in other specified circumstances: Secondary | ICD-10-CM | POA: Diagnosis not present

## 2021-11-28 DIAGNOSIS — K9041 Non-celiac gluten sensitivity: Secondary | ICD-10-CM | POA: Diagnosis not present

## 2021-11-28 DIAGNOSIS — E739 Lactose intolerance, unspecified: Secondary | ICD-10-CM | POA: Diagnosis not present

## 2021-11-28 DIAGNOSIS — Z800 Family history of malignant neoplasm of digestive organs: Secondary | ICD-10-CM | POA: Diagnosis not present

## 2021-11-28 DIAGNOSIS — Z1211 Encounter for screening for malignant neoplasm of colon: Secondary | ICD-10-CM | POA: Diagnosis not present

## 2022-01-06 ENCOUNTER — Other Ambulatory Visit (HOSPITAL_BASED_OUTPATIENT_CLINIC_OR_DEPARTMENT_OTHER): Payer: Self-pay | Admitting: Gastroenterology

## 2022-01-06 ENCOUNTER — Telehealth (HOSPITAL_BASED_OUTPATIENT_CLINIC_OR_DEPARTMENT_OTHER): Payer: Self-pay | Admitting: Family Medicine

## 2022-01-06 ENCOUNTER — Emergency Department (HOSPITAL_COMMUNITY): Payer: BC Managed Care – PPO

## 2022-01-06 ENCOUNTER — Other Ambulatory Visit: Payer: Self-pay | Admitting: Gastroenterology

## 2022-01-06 ENCOUNTER — Emergency Department (HOSPITAL_BASED_OUTPATIENT_CLINIC_OR_DEPARTMENT_OTHER)
Admission: EM | Admit: 2022-01-06 | Discharge: 2022-01-06 | Disposition: A | Discharge status: Home / Self Care | Payer: BC Managed Care – PPO | Attending: Emergency Medicine | Admitting: Emergency Medicine

## 2022-01-06 ENCOUNTER — Encounter (HOSPITAL_BASED_OUTPATIENT_CLINIC_OR_DEPARTMENT_OTHER): Payer: Self-pay

## 2022-01-06 ENCOUNTER — Other Ambulatory Visit: Payer: Self-pay

## 2022-01-06 DIAGNOSIS — R1031 Right lower quadrant pain: Secondary | ICD-10-CM | POA: Diagnosis not present

## 2022-01-06 DIAGNOSIS — R197 Diarrhea, unspecified: Secondary | ICD-10-CM | POA: Diagnosis not present

## 2022-01-06 DIAGNOSIS — R1084 Generalized abdominal pain: Secondary | ICD-10-CM | POA: Insufficient documentation

## 2022-01-06 DIAGNOSIS — R1032 Left lower quadrant pain: Secondary | ICD-10-CM

## 2022-01-06 LAB — COMPREHENSIVE METABOLIC PANEL
ALT: 11 U/L (ref 0–44)
AST: 15 U/L (ref 15–41)
Albumin: 4.9 g/dL (ref 3.5–5.0)
Alkaline Phosphatase: 40 U/L (ref 38–126)
Anion gap: 10 (ref 5–15)
BUN: 5 mg/dL — ABNORMAL LOW (ref 6–20)
CO2: 24 mmol/L (ref 22–32)
Calcium: 10.3 mg/dL (ref 8.9–10.3)
Chloride: 104 mmol/L (ref 98–111)
Creatinine, Ser: 0.73 mg/dL (ref 0.44–1.00)
GFR, Estimated: >60 mL/min (ref >60)
Glucose, Bld: 107 mg/dL — ABNORMAL HIGH (ref 70–99)
Potassium: 3.8 mmol/L (ref 3.5–5.1)
Sodium: 138 mmol/L (ref 135–145)
Total Bilirubin: 0.7 mg/dL (ref 0.3–1.2)
Total Protein: 8.4 g/dL — ABNORMAL HIGH (ref 6.5–8.1)

## 2022-01-06 LAB — CBC
HCT: 43.2 % (ref 36.0–46.0)
Hemoglobin: 14.2 g/dL (ref 12.0–15.0)
MCH: 30.3 pg (ref 26.0–34.0)
MCHC: 32.9 g/dL (ref 30.0–36.0)
MCV: 92.3 fL (ref 80.0–100.0)
Platelets: 249 10*3/uL (ref 150–400)
RBC: 4.68 MIL/uL (ref 3.87–5.11)
RDW: 12.5 % (ref 11.5–15.5)
WBC: 6 10*3/uL (ref 4.0–10.5)
nRBC: 0 % (ref 0.0–0.2)

## 2022-01-06 LAB — URINALYSIS, ROUTINE W REFLEX MICROSCOPIC
Bilirubin Urine: NEGATIVE
Glucose, UA: NEGATIVE mg/dL
Ketones, ur: NEGATIVE mg/dL
Leukocytes,Ua: NEGATIVE
Nitrite: NEGATIVE
Protein, ur: NEGATIVE mg/dL
RBC / HPF: >50 RBC/hpf — ABNORMAL HIGH (ref 0–5)
Specific Gravity, Urine: 1.01 (ref 1.005–1.030)
pH: 5 (ref 5.0–8.0)

## 2022-01-06 LAB — PREGNANCY, URINE: Preg Test, Ur: NEGATIVE

## 2022-01-06 LAB — LIPASE, BLOOD: Lipase: 23 U/L (ref 11–51)

## 2022-01-06 MED ORDER — ONDANSETRON HCL 4 MG/2ML IJ SOLN
4.0000 mg | Freq: Once | INTRAMUSCULAR | Status: AC
Start: 2022-01-06 — End: 2022-01-06
  Administered 2022-01-06: 4 mg via INTRAVENOUS
  Filled 2022-01-06: qty 2

## 2022-01-06 MED ORDER — PROCHLORPERAZINE EDISYLATE 10 MG/2ML IJ SOLN
10.0000 mg | Freq: Once | INTRAMUSCULAR | Status: AC
  Administered 2022-01-06: 10 mg via INTRAVENOUS
  Filled 2022-01-06: qty 2

## 2022-01-06 MED ORDER — IOHEXOL 300 MG/ML  SOLN
100.0000 mL | Freq: Once | INTRAMUSCULAR | Status: AC | PRN
  Administered 2022-01-06: 100 mL via INTRAVENOUS

## 2022-01-06 MED ORDER — HYDROMORPHONE HCL 1 MG/ML IJ SOLN
0.5000 mg | Freq: Once | INTRAMUSCULAR | Status: AC
  Administered 2022-01-06: 0.5 mg via INTRAVENOUS
  Filled 2022-01-06: qty 1

## 2022-01-06 MED ORDER — SODIUM CHLORIDE 0.9 % IV SOLN: INTRAVENOUS | Status: DC

## 2022-01-06 MED ORDER — SODIUM CHLORIDE 0.9 % IV BOLUS
1000.0000 mL | Freq: Once | INTRAVENOUS | Status: AC
  Administered 2022-01-06: 1000 mL via INTRAVENOUS

## 2022-01-06 NOTE — Telephone Encounter (Signed)
Pt called in regarding having a colonoscopy this Friday 6/23 pt stated that office gave her a medication to take last night and now pt is exasperating abdominal pain with diarrhea. Pt called the office where she is doing the colonoscopy and they did not help her the way she wanted. Wanting to talk to our medical staff. Let pt know that I can put in a message and transfer to nurse line to see what we could do but also the best possible reach out contact is the office that is doing her colonoscopy due to them giving her the medication and about to do her colonoscopy. Pt understood but still wanted to see what our office could do for her.  Please advise.

## 2022-01-06 NOTE — ED Provider Notes (Signed)
[facility] EMERGENCY DEPARTMENT Provider Note   CSN: [id number] Arrival date & time: 01/06/22  1227     History  Chief Complaint  Patient presents with   Abdominal Pain    Shelly Burch is a 41 y.o. female.  Presents chief complaint abdominal pain.  Describes an aching cramping worse in the right lower quadrant.  She is followed by gastroenterology and has been taking Linzess in preparation for colonoscopy.  She is been having some diarrhea but had worsening abdominal pain today.  She called her GI office and was sent to [facility] emergency department for work-up for possible appendicitis.  She was sent over here to [facility] for CT scan.  Review of work-up from [facility] shows normal labs normal urinalysis.       Home Medications Prior to Admission medications   Medication Sig Start Date End Date Taking? Authorizing Provider  Cyanocobalamin (VITAMIN B-12 CR PO) Take 1 capsule by mouth daily.    [provider]  diphenhydrAMINE (BENADRYL) 25 MG tablet Take 25-50 mg by mouth 2 (two) times daily as needed for allergies (Take 1 tablet by mouth in the morning and two tablets by mouth at night as needed)).     [provider]  hydrOXYzine (ATARAX) 10 MG tablet Take 1 tablet (10 mg total) by mouth every 6 (six) hours as needed. Patient not taking: Reported on 11/06/2021 07/16/21   de Peru, Buren Kos, MD  loratadine (CLARITIN) 10 MG tablet Take 10 mg by mouth daily. Patient not taking: Reported on 11/06/2021    [provider]  Multiple Vitamins-Minerals (MULTIVITAMIN ADULTS PO) Take 1 tablet by mouth daily.    [provider]  triamcinolone (NASACORT) 55 MCG/ACT AERO nasal inhaler Place 2 sprays into the nose daily. 07/16/21   de Peru, Raymond J, MD      Allergies    Patient has no known allergies.    Review of Systems   Review of Systems  Constitutional:  Negative for fever.  HENT:  Negative for ear pain.   Eyes:  Negative  for pain.  Respiratory:  Negative for cough.   Cardiovascular:  Negative for chest pain.  Gastrointestinal:  Positive for abdominal pain.  Genitourinary:  Negative for flank pain.  Musculoskeletal:  Negative for back pain.  Skin:  Negative for rash.  Neurological:  Negative for headaches.    Physical Exam Updated Vital Signs BP 130/88   Pulse (!) 54   Temp 98.1 F (36.7 C)   Resp 17   Ht 5\' 5"  (1.651 m)   Wt 69.4 kg   SpO2 100%   BMI 25.46 kg/m  Physical Exam Constitutional:      General: She is not in acute distress.    Appearance: Normal appearance.  HENT:     Head: Normocephalic.     Nose: Nose normal.  Eyes:     Extraocular Movements: Extraocular movements intact.  Cardiovascular:     Rate and Rhythm: Normal rate.  Pulmonary:     Effort: Pulmonary effort is normal.  Abdominal:     Tenderness: There is no abdominal tenderness. There is no guarding or rebound.  Musculoskeletal:        General: Normal range of motion.     Cervical back: Normal range of motion.  Neurological:     General: No focal deficit present.     Mental Status: She is alert. Mental status is at baseline.     ED Results /  Procedures / Treatments   Labs (all labs ordered are listed, but only abnormal results are displayed) Labs Reviewed  COMPREHENSIVE METABOLIC PANEL - Abnormal; Notable for the following components:      Result Value   Glucose, Bld 107 (*)    BUN 5 (*)    Total Protein 8.4 (*)    All other components within normal limits  URINALYSIS, ROUTINE W REFLEX MICROSCOPIC - Abnormal; Notable for the following components:   Color, Urine COLORLESS (*)    Hgb urine dipstick LARGE (*)    RBC / HPF >50 (*)    Bacteria, UA RARE (*)    All other components within normal limits  LIPASE, BLOOD  CBC  PREGNANCY, URINE    EKG None  Radiology CT Abdomen Pelvis W Contrast  Result Date: 01/06/2022 CLINICAL DATA:  Right lower quadrant pain EXAM: CT ABDOMEN AND PELVIS WITH CONTRAST  TECHNIQUE: Multidetector CT imaging of the abdomen and pelvis was performed using the standard protocol following bolus administration of intravenous contrast. RADIATION DOSE REDUCTION: This exam was performed according to the departmental dose-optimization program which includes automated exposure control, adjustment of the mA and/or kV according to patient size and/or use of iterative reconstruction technique. CONTRAST:  OMNIPAQUE IOHEXOL 300 MG/ML  SOLN COMPARISON:  None Available. FINDINGS: Lower chest: No acute abnormality. Hepatobiliary: No focal liver abnormality is seen. No gallstones, gallbladder wall thickening, or biliary dilatation. Pancreas: Unremarkable. No pancreatic ductal dilatation or surrounding inflammatory changes. Spleen: Normal in size without focal abnormality. Small splenule medially. Adrenals/Urinary Tract: Adrenal glands are unremarkable. Kidneys are normal, without renal calculi, focal lesion, or hydronephrosis. Bladder is unremarkable. Stomach/Bowel: Stomach is within normal limits. Appendix appears normal. No evidence of bowel wall thickening, distention, or inflammatory changes. Vascular/Lymphatic: No significant vascular findings are present. No enlarged abdominal or pelvic lymph nodes. Reproductive: Uterus and bilateral adnexa are unremarkable. Other: No ascites. Musculoskeletal: No acute or significant osseous findings. IMPRESSION: No acute process identified. Electronically Signed   By: Jannifer Hick M.D.   On: 01/06/2022 19:22    Procedures Procedures    Medications Ordered in ED Medications  0.9 %  sodium chloride infusion ( Intravenous New Bag/Given 01/06/22 1904)  sodium chloride 0.9 % bolus 1,000 mL (1,000 mLs Intravenous New Bag/Given 01/06/22 1604)  ondansetron (ZOFRAN) injection 4 mg (4 mg Intravenous Given 01/06/22 1602)  HYDROmorphone (DILAUDID) injection 0.5 mg (0.5 mg Intravenous Given 01/06/22 1602)  HYDROmorphone (DILAUDID) injection 0.5 mg (0.5 mg  Intravenous Given 01/06/22 1800)  prochlorperazine (COMPAZINE) injection 10 mg (10 mg Intravenous Given 01/06/22 1900)  iohexol (OMNIPAQUE) 300 MG/ML solution 100 mL (100 mLs Intravenous Contrast Given 01/06/22 1916)    ED Course/ Medical Decision Making/ A&P                           Medical Decision Making Amount and/or Complexity of Data Reviewed Labs: ordered. Radiology: ordered.  Risk Prescription drug management.   Review of records shows telephone call family medicine today.  Cardiac monitoring showing sinus rhythm.  Diagnostic labs are reviewed.  White count was normal chemistry was normal review from outside ER..  CT abdomen pelvis pursued with normal-appearing appendix no clear cause for her pain.  Repeat abdominal exam here has no tenderness no guarding no rebound patient states her symptoms have improved and she has no pain.  Given work-up today I doubt acute appendicitis or other acute process.  Suspect possible abdominal cramping aching pain  from her colon prep medication.  Advised her to call her GI doctor and discuss whether they should continue this medication or change.  Patient states understanding, discharged home in stable condition.        Final Clinical Impression(s) / ED Diagnoses Final diagnoses:  Right lower quadrant abdominal pain  Generalized abdominal pain    Rx / DC Orders ED Discharge Orders     None         Cheryll Cockayne, MD 01/06/22 1956

## 2022-01-06 NOTE — ED Provider Notes (Signed)
[facility] EMERGENCY DEPT Provider Note   CSN: [id number] Arrival date & time: 01/06/22  1227     History  Chief Complaint  Patient presents with   Abdominal Pain    Shelly Burch is a 41 y.o. female.  Patient with onset of periumbilical abdominal pain that then started radiating to the right lower quadrant onset was at 2:30 in the morning.  Associated with some diarrhea but patient has been taking Linzess in preparation for colonoscopy later this week.  Not associated with any nausea or vomiting.  Patient is never had pain like this before.  Pain has been constant.  Past medical history significant for headaches ovarian cyst past surgical history significant for left ovarian tumor removal.  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  Prior smoker quit in 2009.  Patient is currently on her menses.       Home Medications Prior to Admission medications   Medication Sig Start Date End Date Taking? Authorizing Provider  Cyanocobalamin (VITAMIN B-12 CR PO) Take 1 capsule by mouth daily.    [provider]  diphenhydrAMINE (BENADRYL) 25 MG tablet Take 25-50 mg by mouth 2 (two) times daily as needed for allergies (Take 1 tablet by mouth in the morning and two tablets by mouth at night as needed)).     [provider]  hydrOXYzine (ATARAX) 10 MG tablet Take 1 tablet (10 mg total) by mouth every 6 (six) hours as needed. Patient not taking: Reported on 11/06/2021 07/16/21   de [place], Buren Kos, MD  loratadine (CLARITIN) 10 MG tablet Take 10 mg by mouth daily. Patient not taking: Reported on 11/06/2021    [provider]  Multiple Vitamins-Minerals (MULTIVITAMIN ADULTS PO) Take 1 tablet by mouth daily.    [provider]  triamcinolone (NASACORT) 55 MCG/ACT AERO nasal inhaler Place 2 sprays into the nose daily. 07/16/21   de [place], Raymond J, MD      Allergies    Patient has no known allergies.     Review of Systems   Review of Systems  Constitutional:  Negative for chills and fever.  HENT:  Negative for ear pain and sore throat.   Eyes:  Negative for pain and visual disturbance.  Respiratory:  Negative for cough and shortness of breath.   Cardiovascular:  Negative for chest pain and palpitations.  Gastrointestinal:  Positive for abdominal pain, diarrhea and nausea. Negative for vomiting.  Genitourinary:  Positive for vaginal bleeding. Negative for dysuria and hematuria.  Musculoskeletal:  Negative for arthralgias and back pain.  Skin:  Negative for color change and rash.  Neurological:  Negative for seizures and syncope.  All other systems reviewed and are negative.   Physical Exam Updated Vital Signs BP 130/74   Pulse (!) 53   Temp 98.1 F (36.7 C)   Resp 13   Ht 1.651 m (5\' 5" )   Wt 69.4 kg   SpO2 100%   BMI 25.46 kg/m  Physical Exam Vitals and nursing note reviewed.  Constitutional:      General: She is not in acute distress.    Appearance: She is well-developed. She is not ill-appearing.  HENT:     Head: Normocephalic and atraumatic.  Eyes:     Conjunctiva/sclera: Conjunctivae normal.  Cardiovascular:     Rate and Rhythm: Normal rate and regular rhythm.     Heart sounds: No murmur heard. Pulmonary:     Effort: Pulmonary effort  is normal. No respiratory distress.     Breath sounds: Normal breath sounds.  Abdominal:     Palpations: Abdomen is soft.     Tenderness: There is abdominal tenderness in the right lower quadrant. There is guarding.     Hernia: No hernia is present.  Musculoskeletal:        General: No swelling.     Cervical back: Neck supple.  Skin:    General: Skin is warm and dry.     Capillary Refill: Capillary refill takes less than 2 seconds.  Neurological:     General: No focal deficit present.     Mental Status: She is alert.  Psychiatric:        Mood and Affect: Mood normal.     ED Results / Procedures / Treatments    Labs (all labs ordered are listed, but only abnormal results are displayed) Labs Reviewed  COMPREHENSIVE METABOLIC PANEL - Abnormal; Notable for the following components:      Result Value   Glucose, Bld 107 (*)    BUN 5 (*)    Total Protein 8.4 (*)    All other components within normal limits  URINALYSIS, ROUTINE W REFLEX MICROSCOPIC - Abnormal; Notable for the following components:   Color, Urine COLORLESS (*)    Hgb urine dipstick LARGE (*)    RBC / HPF >50 (*)    Bacteria, UA RARE (*)    All other components within normal limits  LIPASE, BLOOD  CBC  PREGNANCY, URINE    EKG None  Radiology No results found.  Procedures Procedures    Medications Ordered in ED Medications  0.9 %  sodium chloride infusion (has no administration in time range)  sodium chloride 0.9 % bolus 1,000 mL (1,000 mLs Intravenous New Bag/Given 01/06/22 1604)  ondansetron (ZOFRAN) injection 4 mg (4 mg Intravenous Given 01/06/22 1602)  HYDROmorphone (DILAUDID) injection 0.5 mg (0.5 mg Intravenous Given 01/06/22 1602)    ED Course/ Medical Decision Making/ A&P                           Medical Decision Making Amount and/or Complexity of Data Reviewed Labs: ordered.  Risk Prescription drug management.   CBC no leukocytosis white count 6.0 hemoglobin 14.2.  Complete metabolic panel liver function tests normal.  Electrolytes normal.  Lipase normal.  Pregnancy test negative.  Urinalysis has greater than 50 red blood cells.  But patient is on her menses.  White blood cell count is normal.  Patient not on her menses would have gotten ultrasound renal to rule out stone.  But in the face of the blood most likely related to that.  And pain not really being flank pain.  Feel patient needs CT scan abdomen pelvis to rule out appendicitis.  The only unusual clinical symptom is the fact that she had a lot of the diarrhea.  But standard of care with point tenderness to right lower quadrant with guarding requires  that we rule out appendicitis.  Patient will be transferred into [facility] ED to have the CT scan done.  Accepting will be Dr. Manus Gunning.  Patient treated here with some IV fluids.  Some pain medicine antinausea medicine.   Final Clinical Impression(s) / ED Diagnoses Final diagnoses:  Right lower quadrant abdominal pain    Rx / DC Orders ED Discharge Orders     None         Vanetta Mulders, MD 01/06/22 1620

## 2022-01-06 NOTE — ED Notes (Signed)
[facility] at bedside 

## 2022-01-06 NOTE — ED Triage Notes (Signed)
Patient here POV from Home.  Endorses ABD Pain that began at 0230. Associated with Diarrhea and Nausea. Pain is Mid ABD and radiates to RLQ. Sharp In [place] and Constant as well.   Routine Colonoscopy Scheduled for Friday. No Emesis. Sent for Imaging.   Uncomfortable during Triage. A&Ox4. Gcs 15. Ambulatory.

## 2022-01-06 NOTE — Discharge Instructions (Addendum)
Your appendix appears normal.  There is no clear cause seen for your abdominal pain.  I recommend you speak with your gastroenterology team to discuss whether or not you should continue the medications they started you on.  I recommend holding off until you hear from them.

## 2022-01-06 NOTE — ED Notes (Signed)
Handoff report given to charge nurse Asher Muir at [facility]

## 2022-02-07 ENCOUNTER — Ambulatory Visit (HOSPITAL_BASED_OUTPATIENT_CLINIC_OR_DEPARTMENT_OTHER): Payer: BC Managed Care – PPO | Admitting: Medical

## 2022-02-07 ENCOUNTER — Encounter (HOSPITAL_BASED_OUTPATIENT_CLINIC_OR_DEPARTMENT_OTHER): Payer: Self-pay | Admitting: Medical

## 2022-02-07 VITALS — BP 131/86 | HR 66 | Wt 149.0 lb

## 2022-02-07 DIAGNOSIS — Z1231 Encounter for screening mammogram for malignant neoplasm of breast: Secondary | ICD-10-CM | POA: Diagnosis not present

## 2022-02-07 DIAGNOSIS — N951 Menopausal and female climacteric states: Secondary | ICD-10-CM

## 2022-02-07 DIAGNOSIS — Z01419 Encounter for gynecological examination (general) (routine) without abnormal findings: Secondary | ICD-10-CM | POA: Diagnosis not present

## 2022-02-07 NOTE — Patient Instructions (Signed)
Please call [phone] to schedule mammogram.  

## 2022-02-07 NOTE — Progress Notes (Signed)
History:  Shelly Burch is a 41 y.o. [id number] who presents to clinic today to establish care. She is due for an annual exam, but states a normal pap smear with PCP in the last 3 years. She agrees to sign ROI for records. She states a very remote history of abnormal pap smear around 41 years old. She denies any abnormal pap smears since then. She has never had a mammogram. She is sexually active with one long term female partner. Her partner has had a vasectomy. 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 She was told that during the surgery she "lost a lot of her eggs" from the other ovary and was possibly in early menopause. She has had irregular periods q 2-3 weeks, sometimes light spotting other times bleeding x 7 days for the last few years. She has some hot flashes and night sweats as well. She denies bleeding, abnormal discharge, UTI symptoms, pelvic pain, GI or breast concerns today.    The following portions of the patient's history were reviewed and updated as appropriate: allergies, current medications, family history, past medical history, social history, past surgical history and problem list.  Review of Systems:  Review of Systems  Constitutional:  Negative for fever and malaise/fatigue.  Gastrointestinal:  Negative for abdominal pain, constipation, diarrhea, nausea and vomiting.  Genitourinary:  Negative for dysuria, frequency and urgency.       Neg - vaginal bleeding, discharge, pelvic pain      Objective:  Physical Exam BP 131/86   Pulse 66   Wt 149 lb (67.6 kg)   LMP 01/28/2022 (Exact Date) Comment: Irregular  BMI 24.79 kg/m  Physical Exam Vitals and nursing note reviewed. Exam conducted with a chaperone present.  Constitutional:      General: She is not in acute distress.    Appearance: Normal appearance. She is well-developed and normal weight.  HENT:     Head: Normocephalic and atraumatic.  Neck:     Thyroid: No thyromegaly.   Cardiovascular:     Rate and Rhythm: Normal rate and regular rhythm.     Heart sounds: No murmur heard. Pulmonary:     Effort: Pulmonary effort is normal. No respiratory distress.     Breath sounds: Normal breath sounds. No wheezing.  Abdominal:     General: Abdomen is flat. Bowel sounds are normal. There is no distension.     Palpations: Abdomen is soft. There is no mass.     Tenderness: There is no abdominal tenderness. There is no guarding or rebound.  Musculoskeletal:     Cervical back: Neck supple.  Skin:    General: Skin is warm and dry.     Findings: No erythema.  Neurological:     Mental Status: She is alert and oriented to person, place, and time.  Psychiatric:        Mood and Affect: Mood normal.     Health Maintenance Due  Topic Date Due   COVID-19 Vaccine (1) Never done   Hepatitis C Screening  Never done   PAP SMEAR-Modifier  Never done    Labs, imaging and previous visits in Epic and Care Everywhere reviewed  Assessment & Plan:  1. Encounter for annual routine gynecological examination  2. Breast cancer screening by mammogram - Mammogram ordered   3. Perimenopausal - Due to irregular bleeding, endometrial biopsy was discussed. Patient would like to return for this at a later date. Will schedule today.  -  Continue to monitor bleeding and symptoms   Kathlene Cote 02/07/2022 9:49 AM

## 2022-02-20 ENCOUNTER — Ambulatory Visit (HOSPITAL_BASED_OUTPATIENT_CLINIC_OR_DEPARTMENT_OTHER): Payer: BC Managed Care – PPO | Admitting: Family Medicine

## 2022-03-03 ENCOUNTER — Encounter (HOSPITAL_BASED_OUTPATIENT_CLINIC_OR_DEPARTMENT_OTHER): Payer: Self-pay | Admitting: Family Medicine

## 2022-03-03 ENCOUNTER — Ambulatory Visit (INDEPENDENT_AMBULATORY_CARE_PROVIDER_SITE_OTHER): Payer: BC Managed Care – PPO

## 2022-03-03 ENCOUNTER — Ambulatory Visit (HOSPITAL_BASED_OUTPATIENT_CLINIC_OR_DEPARTMENT_OTHER): Payer: BC Managed Care – PPO | Admitting: Radiology

## 2022-03-03 ENCOUNTER — Ambulatory Visit (HOSPITAL_BASED_OUTPATIENT_CLINIC_OR_DEPARTMENT_OTHER): Payer: BC Managed Care – PPO | Admitting: Family Medicine

## 2022-03-03 DIAGNOSIS — R051 Acute cough: Secondary | ICD-10-CM | POA: Diagnosis not present

## 2022-03-03 DIAGNOSIS — R059 Cough, unspecified: Secondary | ICD-10-CM | POA: Insufficient documentation

## 2022-03-03 MED ORDER — BENZONATATE 200 MG PO CAPS: 200.0000 mg | ORAL_CAPSULE | Freq: Three times a day (TID) | ORAL | 0 refills | Status: DC | PRN

## 2022-03-03 NOTE — Patient Instructions (Signed)
  Medication Instructions:  Your physician recommends that you continue on your current medications as directed. Please refer to the Current Medication list given to you today. --If you need a refill on any your medications before your next appointment, please call your pharmacy first. If no refills are authorized on file call the office.-- Lab Work: Your physician has recommended that you have lab work today: No If you have labs (blood work) drawn today and your tests are completely normal, you will receive your results via MyChart message OR a phone call from our staff.  Please ensure you check your voicemail in the event that you authorized detailed messages to be left on a delegated number. If you have any lab test that is abnormal or we need to change your treatment, we will call you to review the results.  Referrals/Procedures/Imaging: X-Rays  Follow-Up: Your next appointment:   Your physician recommends that you schedule a follow-up appointment as needed with Dr. de [place].  You will receive a text message or e-mail with a link to a survey about your care and experience with us today! We would greatly appreciate your feedback!   Thanks for letting us be apart of your health journey!!  Primary Care and Sports Medicine   Dr. Raymond de [place]   We encourage you to activate your patient portal called "MyChart".  Sign up information is provided on this After Visit Summary.  MyChart is used to connect with patients for Virtual Visits (Telemedicine).  Patients are able to view lab/test results, encounter notes, upcoming appointments, etc.  Non-urgent messages can be sent to your provider as well. To learn more about what you can do with MyChart, please visit --  [email or web address].    

## 2022-03-03 NOTE — Progress Notes (Signed)
    Procedures performed today:    None.  Independent interpretation of notes and tests performed by another provider:   None.  Brief History, Exam, Impression, and Recommendations:    BP (!) 140/86   Pulse 68   Ht 5\' 5"  (1.651 m)   Wt 153 lb 12.8 oz (69.8 kg)   LMP 01/28/2022 (Exact Date) Comment: Irregular  SpO2 100%   BMI 25.59 kg/m   Cough Patient reports that recently she went to beach with family - nieces had a cough at the time, this was about 2 weeks ago. Patient developed a cough, did have some improvements she felt, but then felt that may have worsened about 3-4 days ago. Some mild shortness of breath - tried to exercise today, but was more limited and felt more SOB than she would normally. No fevers, chills or sweats. She has tried Mucinex; has not taken any allergy medicine. On exam, patient is in no acute distress, vital signs stable, afebrile.  Cardiovascular exam with regular rate and rhythm.  Lungs clear to auscultation bilaterally.  Mild pharyngeal erythema, no significant cervical lymphadenopathy. Suspect recent acute viral illness with lingering cough.  Discussed postviral cough syndrome can persist for up to 4 to 6 weeks after acute viral illness. Would recommend continuing with symptomatic measures to help with controlling cough, prescription sent to pharmacy for [facility] We will proceed with chest x-ray given reported worsening of cough over the last few days If desired, could complete at home COVID test, although feel that coronavirus infection is less likely given lack of fever or body aches or GI symptoms  Return if symptoms worsen or fail to improve.   ___________________________________________ Jamear Carbonneau de [facility], MD, ABFM, CAQSM Primary Care and Sports Medicine [facility]

## 2022-03-03 NOTE — Assessment & Plan Note (Signed)
Patient reports that recently she went to beach with family - nieces had a cough at the time, this was about 2 weeks ago. Patient developed a cough, did have some improvements she felt, but then felt that may have worsened about 3-4 days ago. Some mild shortness of breath - tried to exercise today, but was more limited and felt more SOB than she would normally. No fevers, chills or sweats. She has tried Mucinex; has not taken any allergy medicine. On exam, patient is in no acute distress, vital signs stable, afebrile.  Cardiovascular exam with regular rate and rhythm.  Lungs clear to auscultation bilaterally.  Mild pharyngeal erythema, no significant cervical lymphadenopathy. Suspect recent acute viral illness with lingering cough.  Discussed postviral cough syndrome can persist for up to 4 to 6 weeks after acute viral illness. Would recommend continuing with symptomatic measures to help with controlling cough, prescription sent to pharmacy for [facility] We will proceed with chest x-ray given reported worsening of cough over the last few days If desired, could complete at home COVID test, although feel that coronavirus infection is less likely given lack of fever or body aches or GI symptoms

## 2022-05-15 ENCOUNTER — Ambulatory Visit
Admission: RE | Admit: 2022-05-15 | Discharge: 2022-05-15 | Disposition: A | Discharge status: Home / Self Care | Payer: BC Managed Care – PPO | Source: Ambulatory Visit | Attending: Emergency Medicine | Admitting: Emergency Medicine

## 2022-05-15 VITALS — BP 159/89 | HR 59 | Temp 98.8°F | Resp 16

## 2022-05-15 DIAGNOSIS — J309 Allergic rhinitis, unspecified: Secondary | ICD-10-CM

## 2022-05-15 DIAGNOSIS — J0141 Acute recurrent pansinusitis: Secondary | ICD-10-CM | POA: Diagnosis not present

## 2022-05-15 MED ORDER — IBUPROFEN 800 MG PO TABS
800.0000 mg | ORAL_TABLET | Freq: Once | ORAL | Status: AC
  Administered 2022-05-15: 800 mg via ORAL

## 2022-05-15 MED ORDER — FLUTICASONE PROPIONATE 50 MCG/ACT NA SUSP: 1.0000 | Freq: Every day | NASAL | 1 refills | Status: AC

## 2022-05-15 MED ORDER — IBUPROFEN 400 MG PO TABS
400.0000 mg | ORAL_TABLET | Freq: Three times a day (TID) | ORAL | 0 refills | Status: AC | PRN
Start: 2022-05-15 — End: ?

## 2022-05-15 MED ORDER — FEXOFENADINE HCL 180 MG PO TABS: 180.0000 mg | ORAL_TABLET | Freq: Every day | ORAL | 1 refills | Status: AC

## 2022-05-15 MED ORDER — IPRATROPIUM BROMIDE 0.06 % NA SOLN: 2.0000 | Freq: Three times a day (TID) | NASAL | 1 refills | Status: AC

## 2022-05-15 NOTE — ED Provider Notes (Signed)
[facility]    CSN: [id number] Arrival date & time: 05/15/22  1033    HISTORY   Chief Complaint  Patient presents with   Headache   HPI Shelly Burch is a pleasant, 41 y.o. female who presents to urgent care today. Patient with c/o sinus pressure and headache. States she has had cold symptoms off and on for the past few months. Also c/o intermittent nausea. Home COVID-19 test was negative today.  Mom states she has a history of allergies, has not been routinely taking allergy medications because she does not feel like it helps.  Patient denies fever, loss of taste or smell, body aches, chills, vomiting, diarrhea.  Patient denies known sick contacts.  The history is provided by the patient.   Past Medical History:  Diagnosis Date   Abnormal Pap smear    age 37, normal since   Anxiety    Headache(784.0)    Ovarian cyst    Seasonal allergies    Patient Active Problem List   Diagnosis Date Noted   Cough 03/03/2022   Wellness examination 11/06/2021   Anxiety 07/16/2021   Seasonal allergies    Past Surgical History:  Procedure Laterality Date   ovarian tumor removed Left    removal of dermoid cyst   SALPINGOOPHORECTOMY Right    removal of dermoid cyst AND ovary   WISDOM TOOTH EXTRACTION     OB History     Gravida  3   Para  2   Term  2   Preterm  0   AB  1   Living  2      SAB  0   IAB  1   Ectopic  0   Multiple  0   Live Births  2          Home Medications    Prior to Admission medications   Not on File    Family History Family History  Problem Relation Age of Onset   COPD Mother    Hypertension Mother    Heart disease Father    Stroke Father    Heart attack Father    Cancer Father        colon   Diabetes Maternal Grandmother    Diabetes Paternal Grandmother    Cancer Paternal Aunt        breast   Social History Social History   Tobacco Use   Smoking status: Former    Types: Cigarettes    Quit date:  08/07/2007    Years since quitting: 14.7   Smokeless tobacco: Never  Substance Use Topics   Alcohol use: Yes    Alcohol/week: 1.0 standard drink of alcohol    Types: 1 Glasses of wine per week   Drug use: No   Allergies   Dairycare [lactase-lactobacillus]  Review of Systems Review of Systems Pertinent findings revealed after performing a 14 point review of systems has been noted in the history of present illness.  Physical Exam Triage Vital Signs ED Triage Vitals  Enc Vitals Group     BP 05/17/21 0827 (!) 147/82     Pulse Rate 05/17/21 0827 72     Resp 05/17/21 0827 18     Temp 05/17/21 0827 98.3 F (36.8 C)     Temp Source 05/17/21 0827 Oral     SpO2 05/17/21 0827 98 %     Weight --      Height --      Head Circumference --  Peak Flow --      Pain Score 05/17/21 0826 5     Pain Loc --      Pain Edu? --      Excl. in [place]? --   No data found.  Updated Vital Signs BP (!) 159/89 (BP Location: Left Arm)   Pulse (!) 59   Temp 98.8 F (37.1 C) (Oral)   Resp 16   LMP 05/05/2022 (Approximate)   SpO2 98%   Physical Exam Vitals and nursing note reviewed.  Constitutional:      General: She is not in acute distress.    Appearance: Normal appearance. She is not ill-appearing.  HENT:     Head: Normocephalic and atraumatic.     Salivary Glands: Right salivary gland is not diffusely enlarged or tender. Left salivary gland is not diffusely enlarged or tender.     Right Ear: Ear canal and external ear normal. No drainage. A middle ear effusion is present. There is no impacted cerumen. Tympanic membrane is bulging. Tympanic membrane is not injected or erythematous.     Left Ear: Ear canal and external ear normal. No drainage. A middle ear effusion is present. There is no impacted cerumen. Tympanic membrane is bulging. Tympanic membrane is not injected or erythematous.     Ears:     Comments: Bilateral EACs normal, both TMs bulging with clear fluid    Nose: Rhinorrhea present.  No nasal deformity, septal deviation, signs of injury, laceration, nasal tenderness, mucosal edema or congestion. Rhinorrhea is clear.     Right Nostril: Occlusion present. No foreign body, epistaxis or septal hematoma.     Left Nostril: Occlusion present. No foreign body, epistaxis or septal hematoma.     Right Turbinates: Enlarged, swollen and pale.     Left Turbinates: Enlarged, swollen and pale.     Right Sinus: Maxillary sinus tenderness and frontal sinus tenderness present.     Left Sinus: Maxillary sinus tenderness and frontal sinus tenderness present.     Mouth/Throat:     Lips: Pink. No lesions.     Mouth: Mucous membranes are moist. No oral lesions.     Pharynx: Oropharynx is clear. Uvula midline. No posterior oropharyngeal erythema or uvula swelling.     Tonsils: No tonsillar exudate. 0 on the right. 0 on the left.     Comments: Postnasal drip Eyes:     General: Lids are normal.        Right eye: No discharge.        Left eye: No discharge.     Extraocular Movements: Extraocular movements intact.     Conjunctiva/sclera: Conjunctivae normal.     Right eye: Right conjunctiva is not injected.     Left eye: Left conjunctiva is not injected.  Neck:     Trachea: Trachea and phonation normal.  Cardiovascular:     Rate and Rhythm: Normal rate and regular rhythm.     Pulses: Normal pulses.     Heart sounds: Normal heart sounds. No murmur heard.    No friction rub. No gallop.  Pulmonary:     Effort: Pulmonary effort is normal. No accessory muscle usage, prolonged expiration or respiratory distress.     Breath sounds: Normal breath sounds. No stridor, decreased air movement or transmitted upper airway sounds. No decreased breath sounds, wheezing, rhonchi or rales.  Chest:     Chest wall: No tenderness.  Musculoskeletal:        General: Normal range of motion.  Cervical back: Normal range of motion and neck supple. Normal range of motion.  Lymphadenopathy:     Cervical: No  cervical adenopathy.  Skin:    General: Skin is warm and dry.     Findings: No erythema or rash.  Neurological:     General: No focal deficit present.     Mental Status: She is alert and oriented to person, place, and time.  Psychiatric:        Mood and Affect: Mood normal.        Behavior: Behavior normal.     Visual Acuity Right Eye Distance:   Left Eye Distance:   Bilateral Distance:    Right Eye Near:   Left Eye Near:    Bilateral Near:     UC Couse / Diagnostics / Procedures:     Radiology No results found.  Procedures Procedures (including critical care time) EKG  Pending results:  Labs Reviewed - No data to display  Medications Ordered in UC: Medications  ibuprofen (ADVIL) tablet 800 mg (800 mg Oral Given 05/15/22 1146)    UC Diagnoses / Final Clinical Impressions(s)   I have reviewed the triage vital signs and the nursing notes.  Pertinent labs & imaging results that were available during my care of the patient were reviewed by me and considered in my medical decision making (see chart for details).    Final diagnoses:  Allergic rhinitis, unspecified seasonality, unspecified trigger  Acute recurrent pansinusitis   Patient instructed that allergy medications are preventative and to be taken daily not just as needed.  Patient provided with prescriptions for Allegra, Flonase and ipratropium to alleviate current symptoms.  Patient also provided with Advil to help reduce respiratory inflammation and allow her sinuses to better drain.  Do not believe patient will benefit antibiotics at this point.  Viral testing not indicated due to duration of symptoms.  Return precautions advised.  ED Prescriptions     Medication Sig Dispense Auth. Provider   fluticasone (FLONASE) 50 MCG/ACT nasal spray Place 1 spray into both nostrils daily. 47.4 mL Lynden Oxford Scales, PA-C   ipratropium (ATROVENT) 0.06 % nasal spray Place 2 sprays into both nostrils 3 (three) times  daily. As needed for nasal congestion, runny nose 15 mL Lynden Oxford Scales, PA-C   fexofenadine (ALLEGRA) 180 MG tablet Take 1 tablet (180 mg total) by mouth daily. 90 tablet Lynden Oxford Scales, PA-C   ibuprofen (ADVIL) 400 MG tablet Take 1 tablet (400 mg total) by mouth every 8 (eight) hours as needed for up to 30 doses. 30 tablet Lynden Oxford Scales, PA-C      PDMP not reviewed this encounter.  Disposition Upon Discharge:  Condition: stable for discharge home Home: take medications as prescribed; routine discharge instructions as discussed; follow up as advised.  Patient presented with an acute illness with associated systemic symptoms and significant discomfort requiring urgent management. In my opinion, this is a condition that a prudent lay person (someone who possesses an average knowledge of health and medicine) may potentially expect to result in complications if not addressed urgently such as respiratory distress, impairment of bodily function or dysfunction of bodily organs.   Routine symptom specific, illness specific and/or disease specific instructions were discussed with the patient and/or caregiver at length.   As such, the patient has been evaluated and assessed, work-up was performed and treatment was provided in alignment with urgent care protocols and evidence based medicine.  Patient/parent/caregiver has been advised that the patient may  require follow up for further testing and treatment if the symptoms continue in spite of treatment, as clinically indicated and appropriate.  If the patient was tested for COVID-19, Influenza and/or RSV, then the patient/parent/guardian was advised to isolate at home pending the results of his/her diagnostic coronavirus test and potentially longer if they're positive. I have also advised pt that if his/her COVID-19 test returns positive, it's recommended to self-isolate for at least 10 days after symptoms first appeared AND until  fever-free for 24 hours without fever reducer AND other symptoms have improved or resolved. Discussed self-isolation recommendations as well as instructions for household member/close contacts as per the [facility] and [place] [facility], and also gave patient the [place] packet with this information.  Patient/parent/caregiver has been advised to return to the [facility] or PCP in 3-5 days if no better; to PCP or the Emergency Department if new signs and symptoms develop, or if the current signs or symptoms continue to change or worsen for further workup, evaluation and treatment as clinically indicated and appropriate  The patient will follow up with their current PCP if and as advised. If the patient does not currently have a PCP we will assist them in obtaining one.   The patient may need specialty follow up if the symptoms continue, in spite of conservative treatment and management, for further workup, evaluation, consultation and treatment as clinically indicated and appropriate.  Patient/parent/caregiver verbalized understanding and agreement of plan as discussed.  All questions were addressed during visit.  Please see discharge instructions below for further details of plan.  Discharge Instructions:   Discharge Instructions      Your symptoms and my physical exam findings are concerning for exacerbation of your underlying allergies.   To avoid catching frequent respiratory infections, having skin reactions, dealing with eye irritation, losing sleep, missing work, etc., due to uncontrolled allergies, it is important that you begin/continue your allergy regimen and are consistent with taking your meds exactly as prescribed.   Please see the list below for recommended medications, dosages and frequencies to provide relief of current symptoms:     Allegra (fexofenadine): This is an excellent second-generation antihistamine that helps to reduce respiratory inflammatory response to environmental allergens.  This  medication is not known to cause daytime sleepiness so it can be taken in the daytime.  If you find that it does make you sleepy, please feel free to take it at bedtime.   Flonase (fluticasone): This is a steroid nasal spray that you use once daily, 1 spray in each nare.  This medication does not work well if you decide to use it only used as you feel you need to, it works best used on a daily basis.  After 3 to 5 days of use, you will notice significant reduction of the inflammation and mucus production that is currently being caused by exposure to allergens, whether seasonal or environmental.  The most common side effect of this medication is nosebleeds.  If you experience a nosebleed, please discontinue use for 1 week, then feel free to resume.       Atrovent (ipratropium): This is an excellent nasal decongestant spray I have added to your recommended nasal steroid that will not cause rebound congestion, please instill 2 sprays into each nare with each use.  Because nasal steroids can take several days before they begin to provide full benefit, I recommend that you use this spray in addition to the nasal steroid prescribed for you.  Please use it after  you have used your nasal steroid and repeat up to 4 times daily as needed.        Advil, Motrin (ibuprofen): This is a good anti-inflammatory medication which not only addresses aches, pains but also significantly reduces soft tissue inflammation of the upper airways that causes sinus and nasal congestion as well as inflammation of the lower airways which makes you feel like your breathing is constricted or your cough feel tight.  I recommend that you take 400 mg every 8 hours as needed.  You received a single dose of 800 mg during your visit today which should provide you with significant relief of inflammation in your sinuses and promote drainage.   Please note that frequent and prolonged use of antibiotics can increase you risk of diarrhea, C. difficile  infection, allergic reactions such as anaphylaxis, Stevens-Johnson syndrome, drug related rashes, yeast infections, systemic lupus.  Intermittent treatment with oral steroids can cause elevated blood sugar levels, high blood pressure, muscle weakness, swelling in the extremities, mood swings, upset stomach, difficulty sleeping, headaches, fatigue and fragile skin.  Oral steroids can also increase your risk of bacterial infection.  The goal of allergy treatment is prevention.    If you find that your health insurance will not pay for allergy medications, please consider downloading the GoodRx app and using to get a better price than the "off the shelf" price.     If you find that you have not had improvement of your symptoms in the next 5 to 7 days, please follow-up with your primary care provider or return here to urgent care for repeat evaluation and further recommendations.   Thank you for visiting urgent care today.  We appreciate the opportunity to participate in your care.         This office note has been dictated using Museum/gallery curator.  Unfortunately, this method of dictation can sometimes lead to typographical or grammatical errors.  I apologize for your inconvenience in advance if this occurs.  Please do not hesitate to reach out to me if clarification is needed.      Lynden Oxford Scales, PA-C 05/15/22 1520

## 2022-05-15 NOTE — Discharge Instructions (Signed)
Your symptoms and my physical exam findings are concerning for exacerbation of your underlying allergies.   To avoid catching frequent respiratory infections, having skin reactions, dealing with eye irritation, losing sleep, missing work, etc., due to uncontrolled allergies, it is important that you begin/continue your allergy regimen and are consistent with taking your meds exactly as prescribed.   Please see the list below for recommended medications, dosages and frequencies to provide relief of current symptoms:     Allegra (fexofenadine): This is an excellent second-generation antihistamine that helps to reduce respiratory inflammatory response to environmental allergens.  This medication is not known to cause daytime sleepiness so it can be taken in the daytime.  If you find that it does make you sleepy, please feel free to take it at bedtime.   Flonase (fluticasone): This is a steroid nasal spray that you use once daily, 1 spray in each nare.  This medication does not work well if you decide to use it only used as you feel you need to, it works best used on a daily basis.  After 3 to 5 days of use, you will notice significant reduction of the inflammation and mucus production that is currently being caused by exposure to allergens, whether seasonal or environmental.  The most common side effect of this medication is nosebleeds.  If you experience a nosebleed, please discontinue use for 1 week, then feel free to resume.       Atrovent (ipratropium): This is an excellent nasal decongestant spray I have added to your recommended nasal steroid that will not cause rebound congestion, please instill 2 sprays into each nare with each use.  Because nasal steroids can take several days before they begin to provide full benefit, I recommend that you use this spray in addition to the nasal steroid prescribed for you.  Please use it after you have used your nasal steroid and repeat up to 4 times daily as needed.         Advil, Motrin (ibuprofen): This is a good anti-inflammatory medication which not only addresses aches, pains but also significantly reduces soft tissue inflammation of the upper airways that causes sinus and nasal congestion as well as inflammation of the lower airways which makes you feel like your breathing is constricted or your cough feel tight.  I recommend that you take 400 mg every 8 hours as needed.  You received a single dose of 800 mg during your visit today which should provide you with significant relief of inflammation in your sinuses and promote drainage.   Please note that frequent and prolonged use of antibiotics can increase you risk of diarrhea, C. difficile infection, allergic reactions such as anaphylaxis, Stevens-Johnson syndrome, drug related rashes, yeast infections, systemic lupus.  Intermittent treatment with oral steroids can cause elevated blood sugar levels, high blood pressure, muscle weakness, swelling in the extremities, mood swings, upset stomach, difficulty sleeping, headaches, fatigue and fragile skin.  Oral steroids can also increase your risk of bacterial infection.  The goal of allergy treatment is prevention.    If you find that your health insurance will not pay for allergy medications, please consider downloading the GoodRx app and using to get a better price than the "off the shelf" price.     If you find that you have not had improvement of your symptoms in the next 5 to 7 days, please follow-up with your primary care provider or return here to urgent care for repeat evaluation and further recommendations.  Thank you for visiting urgent care today.  We appreciate the opportunity to participate in your care.

## 2022-05-15 NOTE — ED Triage Notes (Signed)
Patient with c/o sinus pressure and headache. States she has had cold symptoms off and on. Also c/o intermittent nausea. Home COVID test was negative.

## 2022-06-06 DIAGNOSIS — F4321 Adjustment disorder with depressed mood: Secondary | ICD-10-CM | POA: Diagnosis not present

## 2022-06-06 DIAGNOSIS — F411 Generalized anxiety disorder: Secondary | ICD-10-CM | POA: Diagnosis not present

## 2022-06-06 DIAGNOSIS — G4700 Insomnia, unspecified: Secondary | ICD-10-CM | POA: Diagnosis not present

## 2022-06-06 DIAGNOSIS — R41840 Attention and concentration deficit: Secondary | ICD-10-CM | POA: Diagnosis not present

## 2022-06-09 DIAGNOSIS — F4321 Adjustment disorder with depressed mood: Secondary | ICD-10-CM | POA: Diagnosis not present

## 2022-06-09 DIAGNOSIS — R41840 Attention and concentration deficit: Secondary | ICD-10-CM | POA: Diagnosis not present

## 2022-06-09 DIAGNOSIS — F411 Generalized anxiety disorder: Secondary | ICD-10-CM | POA: Diagnosis not present

## 2022-06-09 DIAGNOSIS — G4700 Insomnia, unspecified: Secondary | ICD-10-CM | POA: Diagnosis not present

## 2022-07-04 DIAGNOSIS — F4321 Adjustment disorder with depressed mood: Secondary | ICD-10-CM | POA: Diagnosis not present

## 2022-07-04 DIAGNOSIS — F411 Generalized anxiety disorder: Secondary | ICD-10-CM | POA: Diagnosis not present

## 2022-07-04 DIAGNOSIS — R41840 Attention and concentration deficit: Secondary | ICD-10-CM | POA: Diagnosis not present

## 2022-07-04 DIAGNOSIS — G4700 Insomnia, unspecified: Secondary | ICD-10-CM | POA: Diagnosis not present

## 2022-07-08 ENCOUNTER — Ambulatory Visit (INDEPENDENT_AMBULATORY_CARE_PROVIDER_SITE_OTHER): Payer: BC Managed Care – PPO | Admitting: Family Medicine

## 2022-07-08 DIAGNOSIS — U071 COVID-19: Secondary | ICD-10-CM

## 2022-07-08 NOTE — Progress Notes (Signed)
   Virtual Visit via Telephone   I connected with  Shelly Burch  on 07/08/22 by telephone/telehealth and verified that I am speaking with the correct person using two identifiers.   I discussed the limitations, risks, security and privacy concerns of performing an evaluation and management service by telephone, including the higher likelihood of inaccurate diagnosis and treatment, and the availability of in person appointments.  We also discussed the likely need of an additional face to face encounter for complete and high quality delivery of care.  I also discussed with the patient that there may be a patient responsible charge related to this service. The patient expressed understanding and wishes to proceed.  Provider location is in medical facility. Patient location is at their home, different from provider location. People involved in care of the patient during this telehealth encounter were myself, my nurse/medical assistant, and my front office/scheduling team member.  Review of Systems: No fevers, chills, night sweats, weight loss, chest pain, or shortness of breath.   Objective Findings:    General: Speaking full sentences, no audible heavy breathing.  Sounds alert and appropriately interactive.  Independent interpretation of tests performed by another provider:   None.  Brief History, Exam, Impression, and Recommendations:    COVID-19 Patient reports that about 2 days ago, she began to experience sore throat, fatigue, rhinorrhea.  Symptoms have progressed to include body aches, headaches, fever, cough.  Due to the symptoms, she did complete home COVID testing which was positive this morning.  She has been utilizing DayQuil to help with symptoms, notes some relief, however this is short-lived, in particular related to her headaches and bodyaches.  She did call out of work, does work from home.  She denies any notable shortness of breath.  She has had prior COVID-vaccine, however  did not have most recent booster this fall During encounter, patient able to speak in complete sentences, no audible dyspnea, did sound to be congested/ill. We discussed options today given positive test for coronavirus.  Given patient age, prior COVID vaccination, lack of risk factors for progression to severe illness, would recommend proceeding with conservative measures.  We discussed role of COVID-specific treatment with Paxlovid and that there would not be a strong indication for proceeding with this. We reviewed OTC medications and precautions to ensure that excess medications not utilize given that OTC medications may utilize same medications within their formulation.  Recommend ensuring adequate hydration, rest We did discuss isolation/physical distancing/masking precautions We will plan for follow-up as needed  I discussed the above assessment and treatment plan with the patient. The patient was provided an opportunity to ask questions and all were answered. The patient agreed with the plan and demonstrated an understanding of the instructions.  The patient was advised to call back or seek an in-person evaluation if the symptoms worsen or if the condition fails to improve as anticipated.  I provided 12 minutes of face to face and non-face-to-face time during this encounter date, time was needed to gather information, review chart, records, communicate/coordinate with staff remotely, as well as complete documentation.   ___________________________________________ Nafisah Run[place], MD, ABFM, CAQSM [facility]

## 2022-07-08 NOTE — Assessment & Plan Note (Signed)
Patient reports that about 2 days ago, she began to experience sore throat, fatigue, rhinorrhea.  Symptoms have progressed to include body aches, headaches, fever, cough.  Due to the symptoms, she did complete home COVID testing which was positive this morning.  She has been utilizing DayQuil to help with symptoms, notes some relief, however this is short-lived, in particular related to her headaches and bodyaches.  She did call out of work, does work from home.  She denies any notable shortness of breath.  She has had prior COVID-vaccine, however did not have most recent booster this fall During encounter, patient able to speak in complete sentences, no audible dyspnea, did sound to be congested/ill. We discussed options today given positive test for coronavirus.  Given patient age, prior COVID vaccination, lack of risk factors for progression to severe illness, would recommend proceeding with conservative measures.  We discussed role of COVID-specific treatment with Paxlovid and that there would not be a strong indication for proceeding with this. We reviewed OTC medications and precautions to ensure that excess medications not utilize given that OTC medications may utilize same medications within their formulation.  Recommend ensuring adequate hydration, rest We did discuss isolation/physical distancing/masking precautions We will plan for follow-up as needed

## 2022-07-10 DIAGNOSIS — J069 Acute upper respiratory infection, unspecified: Secondary | ICD-10-CM | POA: Diagnosis not present

## 2022-07-10 DIAGNOSIS — U071 COVID-19: Secondary | ICD-10-CM | POA: Diagnosis not present

## 2022-08-01 DIAGNOSIS — G4700 Insomnia, unspecified: Secondary | ICD-10-CM | POA: Diagnosis not present

## 2022-08-01 DIAGNOSIS — F411 Generalized anxiety disorder: Secondary | ICD-10-CM | POA: Diagnosis not present

## 2022-08-01 DIAGNOSIS — F4321 Adjustment disorder with depressed mood: Secondary | ICD-10-CM | POA: Diagnosis not present

## 2022-09-01 ENCOUNTER — Encounter (HOSPITAL_BASED_OUTPATIENT_CLINIC_OR_DEPARTMENT_OTHER): Payer: Self-pay

## 2022-11-05 ENCOUNTER — Ambulatory Visit (HOSPITAL_BASED_OUTPATIENT_CLINIC_OR_DEPARTMENT_OTHER): Payer: BC Managed Care – PPO

## 2022-11-10 ENCOUNTER — Encounter (HOSPITAL_BASED_OUTPATIENT_CLINIC_OR_DEPARTMENT_OTHER): Payer: BC Managed Care – PPO | Admitting: Family Medicine

## 2022-11-11 DIAGNOSIS — S0501XA Injury of conjunctiva and corneal abrasion without foreign body, right eye, initial encounter: Secondary | ICD-10-CM | POA: Diagnosis not present

## 2022-11-11 DIAGNOSIS — H02811 Retained foreign body in right upper eyelid: Secondary | ICD-10-CM | POA: Diagnosis not present

## 2022-11-12 DIAGNOSIS — S0501XD Injury of conjunctiva and corneal abrasion without foreign body, right eye, subsequent encounter: Secondary | ICD-10-CM | POA: Diagnosis not present

## 2023-04-02 DIAGNOSIS — G4700 Insomnia, unspecified: Secondary | ICD-10-CM | POA: Diagnosis not present

## 2023-04-02 DIAGNOSIS — F411 Generalized anxiety disorder: Secondary | ICD-10-CM | POA: Diagnosis not present

## 2023-04-02 DIAGNOSIS — R41840 Attention and concentration deficit: Secondary | ICD-10-CM | POA: Diagnosis not present

## 2023-04-08 DIAGNOSIS — R051 Acute cough: Secondary | ICD-10-CM | POA: Diagnosis not present

## 2023-04-08 DIAGNOSIS — Z20822 Contact with and (suspected) exposure to covid-19: Secondary | ICD-10-CM | POA: Diagnosis not present

## 2023-04-08 DIAGNOSIS — R519 Headache, unspecified: Secondary | ICD-10-CM | POA: Diagnosis not present

## 2023-04-08 DIAGNOSIS — J029 Acute pharyngitis, unspecified: Secondary | ICD-10-CM | POA: Diagnosis not present

## 2023-07-20 DIAGNOSIS — J028 Acute pharyngitis due to other specified organisms: Secondary | ICD-10-CM | POA: Diagnosis not present

## 2023-07-20 DIAGNOSIS — J019 Acute sinusitis, unspecified: Secondary | ICD-10-CM | POA: Diagnosis not present

## 2023-07-20 DIAGNOSIS — J040 Acute laryngitis: Secondary | ICD-10-CM | POA: Diagnosis not present

## 2023-07-20 DIAGNOSIS — B9689 Other specified bacterial agents as the cause of diseases classified elsewhere: Secondary | ICD-10-CM | POA: Diagnosis not present

## 2023-09-10 IMAGING — CT CT ABD-PELV W/ CM
2 of 4 series · 17 of 46 positions shown, 19 images · IV contrast (APPLIED)
Comparison: None Available.

CLINICAL DATA: Right lower quadrant pain

EXAM:
CT ABDOMEN AND PELVIS WITH CONTRAST
TECHNIQUE: Multidetector CT imaging of the abdomen and pelvis was performed
using the standard protocol following bolus administration of
intravenous contrast.

[Series 3: abd/ pelvis 5.0 i30f 2 · axial · 0.90mm/px · z∈[+719,+1119]mm · 14 of 88 slices shown, 16 images]
[im 4/88  soft-tissue]
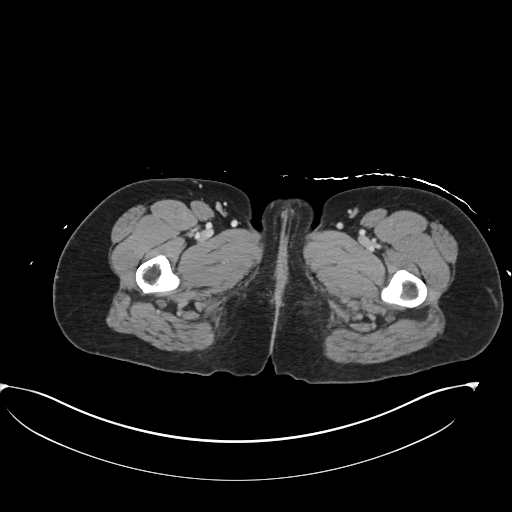
[im 4/88  bone]
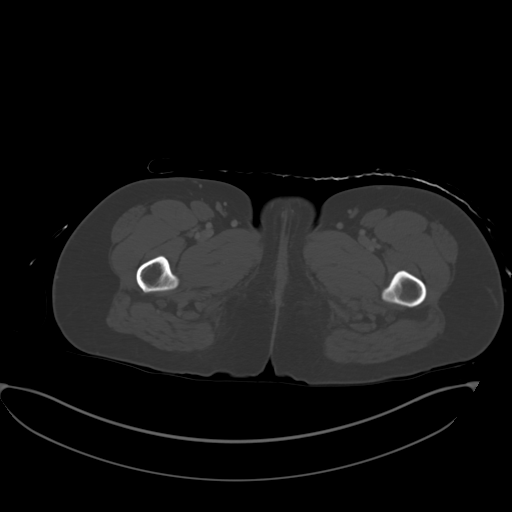
[im 11/88  soft-tissue]
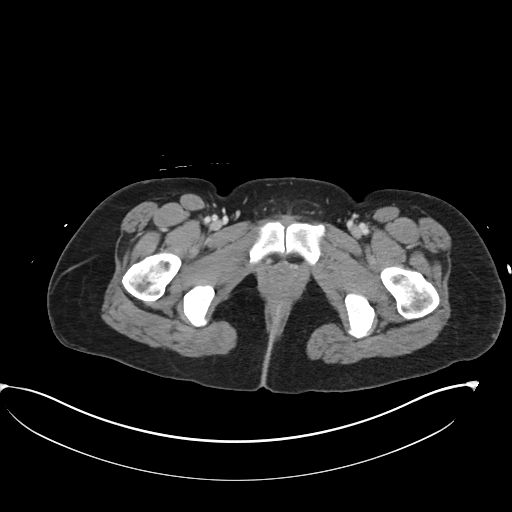
[im 19/88  soft-tissue]
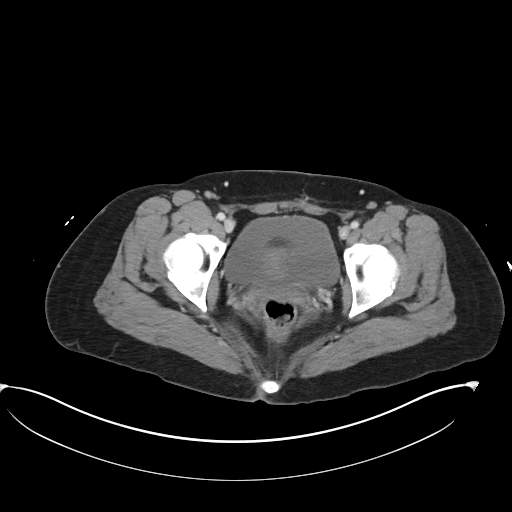
[im 22/88  soft-tissue]
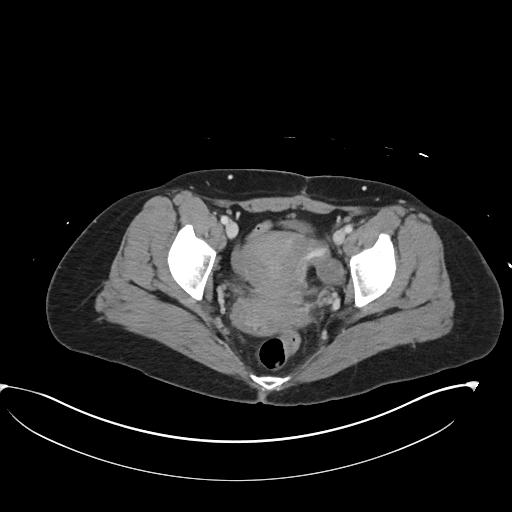
[im 30/88  soft-tissue]
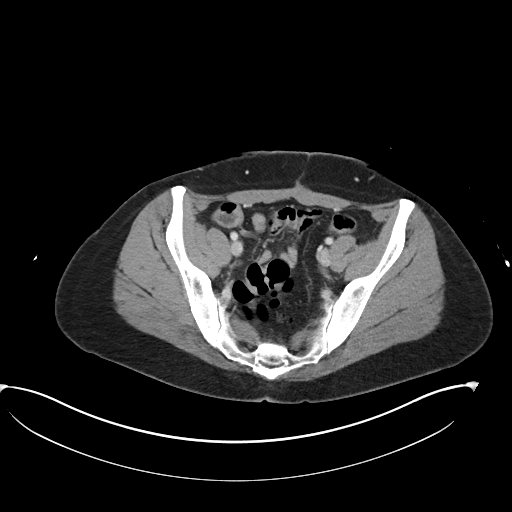
[im 37/88  soft-tissue]
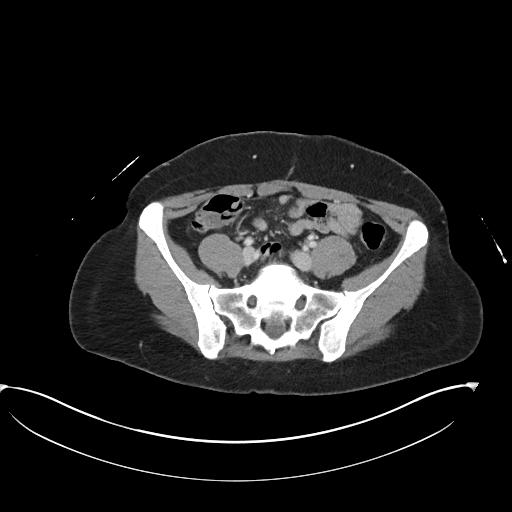
[im 40/88  soft-tissue]
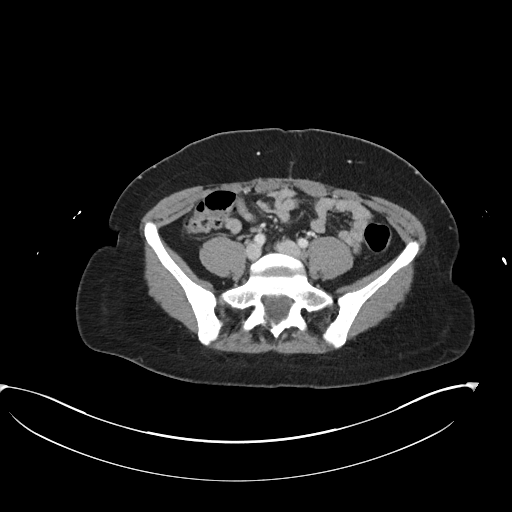
[im 48/88  soft-tissue]
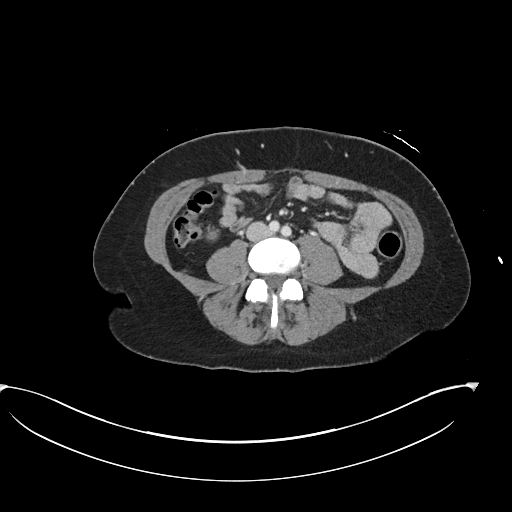
[im 51/88  soft-tissue]
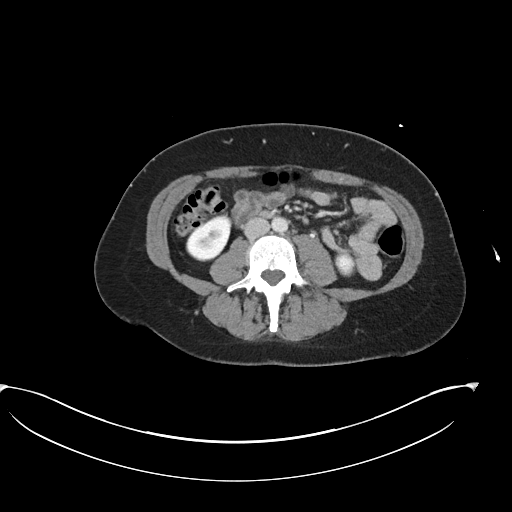
[im 51/88  bone]
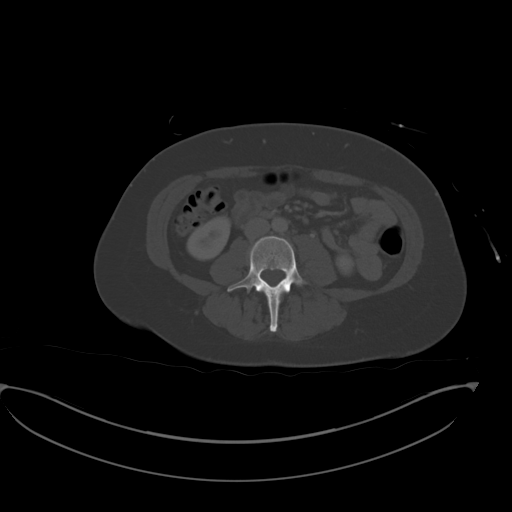
[im 59/88  soft-tissue]
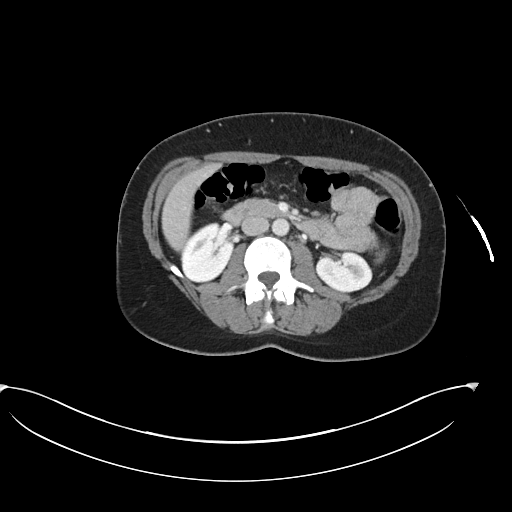
[im 66/88  soft-tissue]
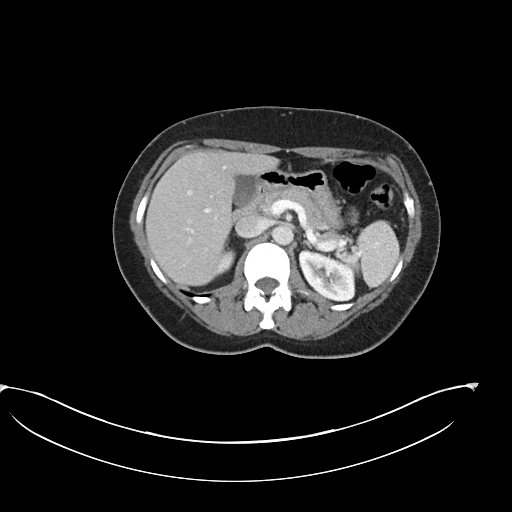
[im 69/88  soft-tissue]
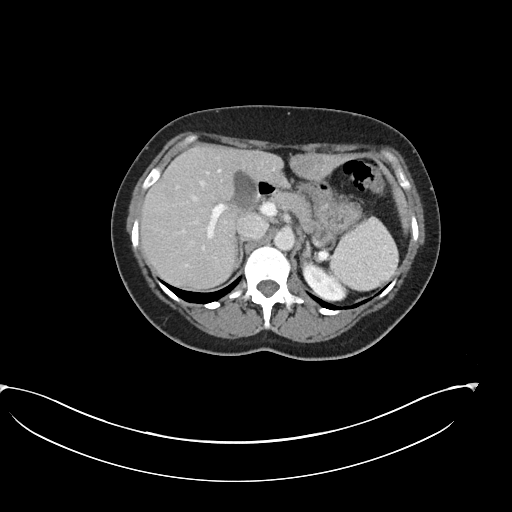
[im 77/88  soft-tissue]
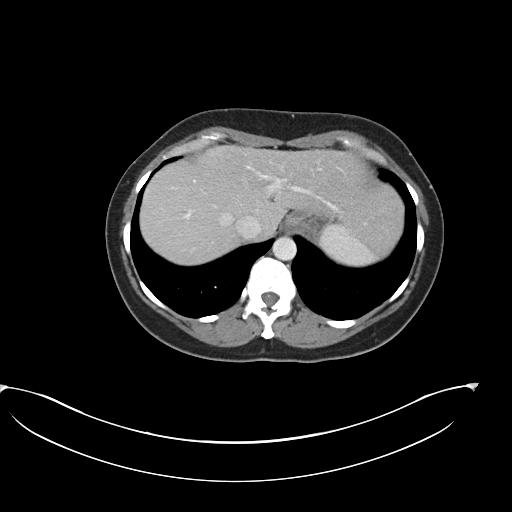
[im 84/88  soft-tissue]
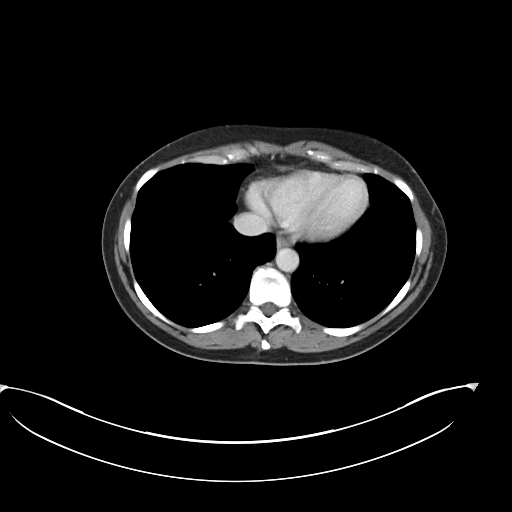

[Series 6: coronal soft tissue · coronal · 0.81mm/px · 3 of 100 slices shown]
[im 34/100  soft-tissue]
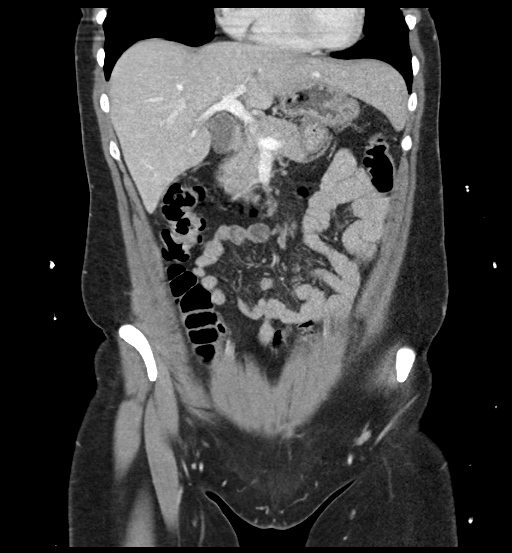
[im 45/100  soft-tissue]
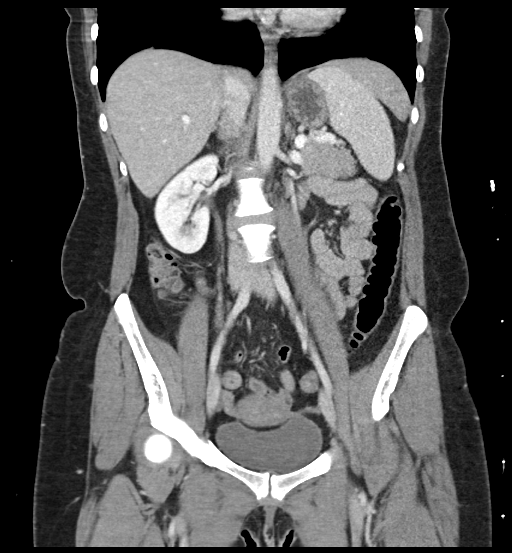
[im 56/100  soft-tissue]
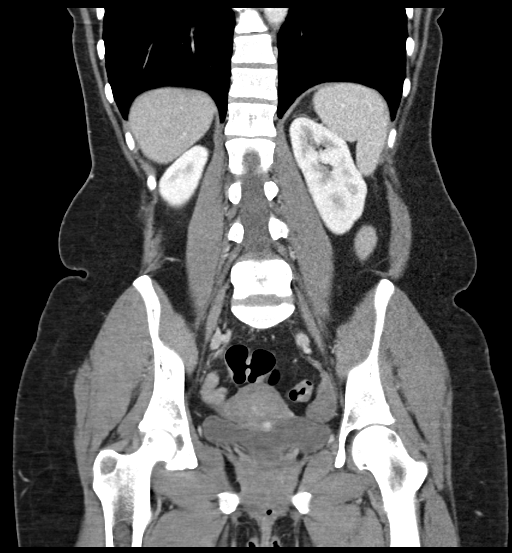

[17 of 46 positions shown; findings below may reference images not displayed]

RADIATION DOSE REDUCTION: This exam was performed according to the
departmental dose-optimization program which includes automated
exposure control, adjustment of the mA and/or kV according to
patient size and/or use of iterative reconstruction technique.

CONTRAST:  100mL OMNIPAQUE IOHEXOL 300 MG/ML  SOLN
FINDINGS: Lower chest: No acute abnormality.

Hepatobiliary: No focal liver abnormality is seen. No gallstones,
gallbladder wall thickening, or biliary dilatation.

Pancreas: Unremarkable. No pancreatic ductal dilatation or
surrounding inflammatory changes.

Spleen: Normal in size without focal abnormality. Small splenule
medially.

Adrenals/Urinary Tract: Adrenal glands are unremarkable. Kidneys are
normal, without renal calculi, focal lesion, or hydronephrosis.
Bladder is unremarkable.

Stomach/Bowel: Stomach is within normal limits. Appendix appears
normal. No evidence of bowel wall thickening, distention, or
inflammatory changes.

Vascular/Lymphatic: No significant vascular findings are present. No
enlarged abdominal or pelvic lymph nodes.

Reproductive: Uterus and bilateral adnexa are unremarkable.

Other: No ascites.

Musculoskeletal: No acute or significant osseous findings.
IMPRESSION: No acute process identified.
